# Patient Record
Sex: Female | Born: 1935
Health system: Southern US, Community
[De-identification: ages and names within clinical notes are randomized; demographics above are authoritative.]

## PROBLEM LIST (undated history)

## (undated) DIAGNOSIS — E7800 Pure hypercholesterolemia, unspecified: Secondary | ICD-10-CM

## (undated) DIAGNOSIS — C439 Malignant melanoma of skin, unspecified: Secondary | ICD-10-CM

## (undated) DIAGNOSIS — F0390 Unspecified dementia without behavioral disturbance: Secondary | ICD-10-CM

## (undated) DIAGNOSIS — C801 Malignant (primary) neoplasm, unspecified: Secondary | ICD-10-CM

## (undated) DIAGNOSIS — E079 Disorder of thyroid, unspecified: Secondary | ICD-10-CM

## (undated) DIAGNOSIS — I10 Essential (primary) hypertension: Secondary | ICD-10-CM

## (undated) HISTORY — PX: ABDOMINAL HYSTERECTOMY: SHX81

## (undated) HISTORY — PX: EYE MUSCLE SURGERY: SHX370

---

## 2001-12-12 ENCOUNTER — Emergency Department (HOSPITAL_COMMUNITY): Admission: EM | Admit: 2001-12-12 | Discharge: 2001-12-12 | Payer: Self-pay | Admitting: Emergency Medicine

## 2001-12-12 ENCOUNTER — Encounter: Payer: Self-pay | Admitting: Emergency Medicine

## 2010-04-13 ENCOUNTER — Emergency Department (HOSPITAL_COMMUNITY): Admission: EM | Admit: 2010-04-13 | Discharge: 2010-04-13 | Payer: Self-pay | Admitting: Emergency Medicine

## 2011-11-03 DIAGNOSIS — H04129 Dry eye syndrome of unspecified lacrimal gland: Secondary | ICD-10-CM | POA: Diagnosis not present

## 2011-11-03 DIAGNOSIS — H2510 Age-related nuclear cataract, unspecified eye: Secondary | ICD-10-CM | POA: Diagnosis not present

## 2011-11-03 DIAGNOSIS — H35039 Hypertensive retinopathy, unspecified eye: Secondary | ICD-10-CM | POA: Diagnosis not present

## 2011-11-03 DIAGNOSIS — H353190 Nonexudative age-related macular degeneration, unspecified eye, stage unspecified: Secondary | ICD-10-CM | POA: Diagnosis not present

## 2012-01-13 DIAGNOSIS — Z1231 Encounter for screening mammogram for malignant neoplasm of breast: Secondary | ICD-10-CM | POA: Diagnosis not present

## 2012-01-16 DIAGNOSIS — E7800 Pure hypercholesterolemia, unspecified: Secondary | ICD-10-CM | POA: Diagnosis not present

## 2012-01-16 DIAGNOSIS — Z79899 Other long term (current) drug therapy: Secondary | ICD-10-CM | POA: Diagnosis not present

## 2012-01-16 DIAGNOSIS — K219 Gastro-esophageal reflux disease without esophagitis: Secondary | ICD-10-CM | POA: Diagnosis not present

## 2012-01-16 DIAGNOSIS — E039 Hypothyroidism, unspecified: Secondary | ICD-10-CM | POA: Diagnosis not present

## 2012-05-01 DIAGNOSIS — H2510 Age-related nuclear cataract, unspecified eye: Secondary | ICD-10-CM | POA: Diagnosis not present

## 2012-05-01 DIAGNOSIS — H35039 Hypertensive retinopathy, unspecified eye: Secondary | ICD-10-CM | POA: Diagnosis not present

## 2012-05-01 DIAGNOSIS — H35379 Puckering of macula, unspecified eye: Secondary | ICD-10-CM | POA: Diagnosis not present

## 2012-05-01 DIAGNOSIS — H353190 Nonexudative age-related macular degeneration, unspecified eye, stage unspecified: Secondary | ICD-10-CM | POA: Diagnosis not present

## 2012-05-01 DIAGNOSIS — H35369 Drusen (degenerative) of macula, unspecified eye: Secondary | ICD-10-CM | POA: Diagnosis not present

## 2012-07-18 DIAGNOSIS — Z79899 Other long term (current) drug therapy: Secondary | ICD-10-CM | POA: Diagnosis not present

## 2012-07-18 DIAGNOSIS — N951 Menopausal and female climacteric states: Secondary | ICD-10-CM | POA: Diagnosis not present

## 2012-07-18 DIAGNOSIS — E7800 Pure hypercholesterolemia, unspecified: Secondary | ICD-10-CM | POA: Diagnosis not present

## 2012-07-18 DIAGNOSIS — K219 Gastro-esophageal reflux disease without esophagitis: Secondary | ICD-10-CM | POA: Diagnosis not present

## 2012-07-18 DIAGNOSIS — Z23 Encounter for immunization: Secondary | ICD-10-CM | POA: Diagnosis not present

## 2012-07-18 DIAGNOSIS — E039 Hypothyroidism, unspecified: Secondary | ICD-10-CM | POA: Diagnosis not present

## 2013-01-13 DIAGNOSIS — Z1231 Encounter for screening mammogram for malignant neoplasm of breast: Secondary | ICD-10-CM | POA: Diagnosis not present

## 2013-02-05 DIAGNOSIS — Z0000 Encounter for general adult medical examination without abnormal findings: Secondary | ICD-10-CM | POA: Diagnosis not present

## 2013-05-01 DIAGNOSIS — H3530 Unspecified macular degeneration: Secondary | ICD-10-CM | POA: Diagnosis not present

## 2013-05-01 DIAGNOSIS — H04129 Dry eye syndrome of unspecified lacrimal gland: Secondary | ICD-10-CM | POA: Diagnosis not present

## 2013-05-01 DIAGNOSIS — H35039 Hypertensive retinopathy, unspecified eye: Secondary | ICD-10-CM | POA: Diagnosis not present

## 2013-05-01 DIAGNOSIS — Z961 Presence of intraocular lens: Secondary | ICD-10-CM | POA: Diagnosis not present

## 2013-05-01 DIAGNOSIS — H353190 Nonexudative age-related macular degeneration, unspecified eye, stage unspecified: Secondary | ICD-10-CM | POA: Diagnosis not present

## 2013-05-01 DIAGNOSIS — H2510 Age-related nuclear cataract, unspecified eye: Secondary | ICD-10-CM | POA: Diagnosis not present

## 2013-05-28 ENCOUNTER — Encounter (INDEPENDENT_AMBULATORY_CARE_PROVIDER_SITE_OTHER): Payer: Medicare Other | Admitting: Ophthalmology

## 2013-05-28 DIAGNOSIS — H2510 Age-related nuclear cataract, unspecified eye: Secondary | ICD-10-CM

## 2013-05-28 DIAGNOSIS — H353290 Exudative age-related macular degeneration, unspecified eye, stage unspecified: Secondary | ICD-10-CM | POA: Diagnosis not present

## 2013-05-28 DIAGNOSIS — H3530 Unspecified macular degeneration: Secondary | ICD-10-CM | POA: Diagnosis not present

## 2013-05-28 DIAGNOSIS — H43819 Vitreous degeneration, unspecified eye: Secondary | ICD-10-CM

## 2013-06-25 ENCOUNTER — Encounter (INDEPENDENT_AMBULATORY_CARE_PROVIDER_SITE_OTHER): Payer: Medicare Other | Admitting: Ophthalmology

## 2013-06-25 DIAGNOSIS — H43819 Vitreous degeneration, unspecified eye: Secondary | ICD-10-CM | POA: Diagnosis not present

## 2013-06-25 DIAGNOSIS — H353290 Exudative age-related macular degeneration, unspecified eye, stage unspecified: Secondary | ICD-10-CM

## 2013-06-25 DIAGNOSIS — H2510 Age-related nuclear cataract, unspecified eye: Secondary | ICD-10-CM | POA: Diagnosis not present

## 2013-06-25 DIAGNOSIS — H3530 Unspecified macular degeneration: Secondary | ICD-10-CM

## 2013-07-23 ENCOUNTER — Encounter (INDEPENDENT_AMBULATORY_CARE_PROVIDER_SITE_OTHER): Payer: Medicare Other | Admitting: Ophthalmology

## 2013-07-23 DIAGNOSIS — H2510 Age-related nuclear cataract, unspecified eye: Secondary | ICD-10-CM | POA: Diagnosis not present

## 2013-07-23 DIAGNOSIS — H3530 Unspecified macular degeneration: Secondary | ICD-10-CM | POA: Diagnosis not present

## 2013-07-23 DIAGNOSIS — H353290 Exudative age-related macular degeneration, unspecified eye, stage unspecified: Secondary | ICD-10-CM | POA: Diagnosis not present

## 2013-07-23 DIAGNOSIS — H43819 Vitreous degeneration, unspecified eye: Secondary | ICD-10-CM

## 2013-07-27 DIAGNOSIS — Z23 Encounter for immunization: Secondary | ICD-10-CM | POA: Diagnosis not present

## 2013-08-20 ENCOUNTER — Encounter (INDEPENDENT_AMBULATORY_CARE_PROVIDER_SITE_OTHER): Payer: Medicare Other | Admitting: Ophthalmology

## 2013-08-20 DIAGNOSIS — H353290 Exudative age-related macular degeneration, unspecified eye, stage unspecified: Secondary | ICD-10-CM

## 2013-08-20 DIAGNOSIS — H43819 Vitreous degeneration, unspecified eye: Secondary | ICD-10-CM

## 2013-08-20 DIAGNOSIS — H3530 Unspecified macular degeneration: Secondary | ICD-10-CM

## 2013-08-25 DIAGNOSIS — E7800 Pure hypercholesterolemia, unspecified: Secondary | ICD-10-CM | POA: Diagnosis not present

## 2013-08-25 DIAGNOSIS — R030 Elevated blood-pressure reading, without diagnosis of hypertension: Secondary | ICD-10-CM | POA: Diagnosis not present

## 2013-08-25 DIAGNOSIS — K219 Gastro-esophageal reflux disease without esophagitis: Secondary | ICD-10-CM | POA: Diagnosis not present

## 2013-08-25 DIAGNOSIS — H3530 Unspecified macular degeneration: Secondary | ICD-10-CM | POA: Diagnosis not present

## 2013-08-25 DIAGNOSIS — E039 Hypothyroidism, unspecified: Secondary | ICD-10-CM | POA: Diagnosis not present

## 2013-08-25 DIAGNOSIS — H3550 Unspecified hereditary retinal dystrophy: Secondary | ICD-10-CM | POA: Diagnosis not present

## 2013-09-24 ENCOUNTER — Encounter (INDEPENDENT_AMBULATORY_CARE_PROVIDER_SITE_OTHER): Payer: Medicare Other | Admitting: Ophthalmology

## 2013-09-24 DIAGNOSIS — H43819 Vitreous degeneration, unspecified eye: Secondary | ICD-10-CM | POA: Diagnosis not present

## 2013-09-24 DIAGNOSIS — H353290 Exudative age-related macular degeneration, unspecified eye, stage unspecified: Secondary | ICD-10-CM | POA: Diagnosis not present

## 2013-09-24 DIAGNOSIS — H3530 Unspecified macular degeneration: Secondary | ICD-10-CM | POA: Diagnosis not present

## 2013-09-24 DIAGNOSIS — H2510 Age-related nuclear cataract, unspecified eye: Secondary | ICD-10-CM

## 2013-11-05 ENCOUNTER — Encounter (INDEPENDENT_AMBULATORY_CARE_PROVIDER_SITE_OTHER): Payer: Medicare Other | Admitting: Ophthalmology

## 2013-11-05 DIAGNOSIS — H43819 Vitreous degeneration, unspecified eye: Secondary | ICD-10-CM

## 2013-11-05 DIAGNOSIS — H2510 Age-related nuclear cataract, unspecified eye: Secondary | ICD-10-CM

## 2013-11-05 DIAGNOSIS — H353290 Exudative age-related macular degeneration, unspecified eye, stage unspecified: Secondary | ICD-10-CM | POA: Diagnosis not present

## 2013-11-05 DIAGNOSIS — H3530 Unspecified macular degeneration: Secondary | ICD-10-CM

## 2013-12-17 ENCOUNTER — Encounter (INDEPENDENT_AMBULATORY_CARE_PROVIDER_SITE_OTHER): Payer: Medicare Other | Admitting: Ophthalmology

## 2013-12-17 DIAGNOSIS — H43819 Vitreous degeneration, unspecified eye: Secondary | ICD-10-CM | POA: Diagnosis not present

## 2013-12-17 DIAGNOSIS — H2510 Age-related nuclear cataract, unspecified eye: Secondary | ICD-10-CM | POA: Diagnosis not present

## 2013-12-17 DIAGNOSIS — H3530 Unspecified macular degeneration: Secondary | ICD-10-CM

## 2013-12-17 DIAGNOSIS — H353290 Exudative age-related macular degeneration, unspecified eye, stage unspecified: Secondary | ICD-10-CM

## 2014-01-14 DIAGNOSIS — Z1231 Encounter for screening mammogram for malignant neoplasm of breast: Secondary | ICD-10-CM | POA: Diagnosis not present

## 2014-02-04 ENCOUNTER — Encounter (INDEPENDENT_AMBULATORY_CARE_PROVIDER_SITE_OTHER): Payer: Medicare Other | Admitting: Ophthalmology

## 2014-02-04 DIAGNOSIS — H43819 Vitreous degeneration, unspecified eye: Secondary | ICD-10-CM

## 2014-02-04 DIAGNOSIS — H2510 Age-related nuclear cataract, unspecified eye: Secondary | ICD-10-CM | POA: Diagnosis not present

## 2014-02-04 DIAGNOSIS — H353290 Exudative age-related macular degeneration, unspecified eye, stage unspecified: Secondary | ICD-10-CM | POA: Diagnosis not present

## 2014-02-04 DIAGNOSIS — H3530 Unspecified macular degeneration: Secondary | ICD-10-CM

## 2014-02-09 DIAGNOSIS — H3530 Unspecified macular degeneration: Secondary | ICD-10-CM | POA: Diagnosis not present

## 2014-02-09 DIAGNOSIS — Z1331 Encounter for screening for depression: Secondary | ICD-10-CM | POA: Diagnosis not present

## 2014-02-09 DIAGNOSIS — E7800 Pure hypercholesterolemia, unspecified: Secondary | ICD-10-CM | POA: Diagnosis not present

## 2014-02-09 DIAGNOSIS — K219 Gastro-esophageal reflux disease without esophagitis: Secondary | ICD-10-CM | POA: Diagnosis not present

## 2014-02-09 DIAGNOSIS — H9319 Tinnitus, unspecified ear: Secondary | ICD-10-CM | POA: Diagnosis not present

## 2014-02-09 DIAGNOSIS — Z0000 Encounter for general adult medical examination without abnormal findings: Secondary | ICD-10-CM | POA: Diagnosis not present

## 2014-02-09 DIAGNOSIS — Z23 Encounter for immunization: Secondary | ICD-10-CM | POA: Diagnosis not present

## 2014-02-09 DIAGNOSIS — E039 Hypothyroidism, unspecified: Secondary | ICD-10-CM | POA: Diagnosis not present

## 2014-02-09 DIAGNOSIS — I10 Essential (primary) hypertension: Secondary | ICD-10-CM | POA: Diagnosis not present

## 2014-02-19 DIAGNOSIS — H903 Sensorineural hearing loss, bilateral: Secondary | ICD-10-CM | POA: Diagnosis not present

## 2014-02-19 DIAGNOSIS — H9319 Tinnitus, unspecified ear: Secondary | ICD-10-CM | POA: Diagnosis not present

## 2014-02-25 DIAGNOSIS — E2839 Other primary ovarian failure: Secondary | ICD-10-CM | POA: Diagnosis not present

## 2014-02-25 DIAGNOSIS — M899 Disorder of bone, unspecified: Secondary | ICD-10-CM | POA: Diagnosis not present

## 2014-03-03 DIAGNOSIS — E039 Hypothyroidism, unspecified: Secondary | ICD-10-CM | POA: Diagnosis not present

## 2014-03-03 DIAGNOSIS — M899 Disorder of bone, unspecified: Secondary | ICD-10-CM | POA: Diagnosis not present

## 2014-03-03 DIAGNOSIS — I10 Essential (primary) hypertension: Secondary | ICD-10-CM | POA: Diagnosis not present

## 2014-03-03 DIAGNOSIS — E7800 Pure hypercholesterolemia, unspecified: Secondary | ICD-10-CM | POA: Diagnosis not present

## 2014-03-03 DIAGNOSIS — M949 Disorder of cartilage, unspecified: Secondary | ICD-10-CM | POA: Diagnosis not present

## 2014-03-30 DIAGNOSIS — E039 Hypothyroidism, unspecified: Secondary | ICD-10-CM | POA: Diagnosis not present

## 2014-03-30 DIAGNOSIS — M899 Disorder of bone, unspecified: Secondary | ICD-10-CM | POA: Diagnosis not present

## 2014-03-30 DIAGNOSIS — E7800 Pure hypercholesterolemia, unspecified: Secondary | ICD-10-CM | POA: Diagnosis not present

## 2014-04-08 ENCOUNTER — Encounter (INDEPENDENT_AMBULATORY_CARE_PROVIDER_SITE_OTHER): Payer: Medicare Other | Admitting: Ophthalmology

## 2014-04-08 DIAGNOSIS — H43819 Vitreous degeneration, unspecified eye: Secondary | ICD-10-CM | POA: Diagnosis not present

## 2014-04-08 DIAGNOSIS — H353290 Exudative age-related macular degeneration, unspecified eye, stage unspecified: Secondary | ICD-10-CM | POA: Diagnosis not present

## 2014-04-08 DIAGNOSIS — H3530 Unspecified macular degeneration: Secondary | ICD-10-CM | POA: Diagnosis not present

## 2014-06-10 ENCOUNTER — Encounter (INDEPENDENT_AMBULATORY_CARE_PROVIDER_SITE_OTHER): Payer: Medicare Other | Admitting: Ophthalmology

## 2014-06-10 DIAGNOSIS — H2510 Age-related nuclear cataract, unspecified eye: Secondary | ICD-10-CM | POA: Diagnosis not present

## 2014-06-10 DIAGNOSIS — H43819 Vitreous degeneration, unspecified eye: Secondary | ICD-10-CM | POA: Diagnosis not present

## 2014-06-10 DIAGNOSIS — H353290 Exudative age-related macular degeneration, unspecified eye, stage unspecified: Secondary | ICD-10-CM

## 2014-06-10 DIAGNOSIS — H3530 Unspecified macular degeneration: Secondary | ICD-10-CM

## 2014-08-03 DIAGNOSIS — M899 Disorder of bone, unspecified: Secondary | ICD-10-CM | POA: Diagnosis not present

## 2014-08-03 DIAGNOSIS — G479 Sleep disorder, unspecified: Secondary | ICD-10-CM | POA: Diagnosis not present

## 2014-08-03 DIAGNOSIS — I10 Essential (primary) hypertension: Secondary | ICD-10-CM | POA: Diagnosis not present

## 2014-08-03 DIAGNOSIS — Z23 Encounter for immunization: Secondary | ICD-10-CM | POA: Diagnosis not present

## 2014-08-03 DIAGNOSIS — K219 Gastro-esophageal reflux disease without esophagitis: Secondary | ICD-10-CM | POA: Diagnosis not present

## 2014-08-03 DIAGNOSIS — E780 Pure hypercholesterolemia: Secondary | ICD-10-CM | POA: Diagnosis not present

## 2014-08-03 DIAGNOSIS — E039 Hypothyroidism, unspecified: Secondary | ICD-10-CM | POA: Diagnosis not present

## 2014-08-19 ENCOUNTER — Encounter (INDEPENDENT_AMBULATORY_CARE_PROVIDER_SITE_OTHER): Payer: Medicare Other | Admitting: Ophthalmology

## 2014-08-19 DIAGNOSIS — H3532 Exudative age-related macular degeneration: Secondary | ICD-10-CM

## 2014-08-19 DIAGNOSIS — H43813 Vitreous degeneration, bilateral: Secondary | ICD-10-CM | POA: Diagnosis not present

## 2014-08-19 DIAGNOSIS — H2512 Age-related nuclear cataract, left eye: Secondary | ICD-10-CM | POA: Diagnosis not present

## 2014-08-19 DIAGNOSIS — H3531 Nonexudative age-related macular degeneration: Secondary | ICD-10-CM

## 2014-11-04 ENCOUNTER — Encounter (INDEPENDENT_AMBULATORY_CARE_PROVIDER_SITE_OTHER): Payer: Medicare Other | Admitting: Ophthalmology

## 2014-11-04 DIAGNOSIS — H3531 Nonexudative age-related macular degeneration: Secondary | ICD-10-CM

## 2014-11-04 DIAGNOSIS — H3532 Exudative age-related macular degeneration: Secondary | ICD-10-CM

## 2014-11-04 DIAGNOSIS — H43813 Vitreous degeneration, bilateral: Secondary | ICD-10-CM | POA: Diagnosis not present

## 2014-11-04 DIAGNOSIS — H2512 Age-related nuclear cataract, left eye: Secondary | ICD-10-CM | POA: Diagnosis not present

## 2015-01-13 ENCOUNTER — Encounter (INDEPENDENT_AMBULATORY_CARE_PROVIDER_SITE_OTHER): Payer: Medicare Other | Admitting: Ophthalmology

## 2015-01-13 DIAGNOSIS — H3531 Nonexudative age-related macular degeneration: Secondary | ICD-10-CM

## 2015-01-13 DIAGNOSIS — H43813 Vitreous degeneration, bilateral: Secondary | ICD-10-CM | POA: Diagnosis not present

## 2015-01-13 DIAGNOSIS — H3532 Exudative age-related macular degeneration: Secondary | ICD-10-CM

## 2015-02-12 DIAGNOSIS — E780 Pure hypercholesterolemia: Secondary | ICD-10-CM | POA: Diagnosis not present

## 2015-02-12 DIAGNOSIS — E559 Vitamin D deficiency, unspecified: Secondary | ICD-10-CM | POA: Diagnosis not present

## 2015-02-12 DIAGNOSIS — I10 Essential (primary) hypertension: Secondary | ICD-10-CM | POA: Diagnosis not present

## 2015-02-12 DIAGNOSIS — M85859 Other specified disorders of bone density and structure, unspecified thigh: Secondary | ICD-10-CM | POA: Diagnosis not present

## 2015-02-12 DIAGNOSIS — G479 Sleep disorder, unspecified: Secondary | ICD-10-CM | POA: Diagnosis not present

## 2015-02-12 DIAGNOSIS — E039 Hypothyroidism, unspecified: Secondary | ICD-10-CM | POA: Diagnosis not present

## 2015-02-12 DIAGNOSIS — K219 Gastro-esophageal reflux disease without esophagitis: Secondary | ICD-10-CM | POA: Diagnosis not present

## 2015-02-12 DIAGNOSIS — Z0000 Encounter for general adult medical examination without abnormal findings: Secondary | ICD-10-CM | POA: Diagnosis not present

## 2015-03-09 DIAGNOSIS — L03031 Cellulitis of right toe: Secondary | ICD-10-CM | POA: Diagnosis not present

## 2015-03-09 DIAGNOSIS — M79609 Pain in unspecified limb: Secondary | ICD-10-CM | POA: Diagnosis not present

## 2015-03-31 ENCOUNTER — Encounter (INDEPENDENT_AMBULATORY_CARE_PROVIDER_SITE_OTHER): Payer: Medicare Other | Admitting: Ophthalmology

## 2015-03-31 DIAGNOSIS — H3531 Nonexudative age-related macular degeneration: Secondary | ICD-10-CM | POA: Diagnosis not present

## 2015-03-31 DIAGNOSIS — H3532 Exudative age-related macular degeneration: Secondary | ICD-10-CM | POA: Diagnosis not present

## 2015-03-31 DIAGNOSIS — H2512 Age-related nuclear cataract, left eye: Secondary | ICD-10-CM

## 2015-03-31 DIAGNOSIS — H43813 Vitreous degeneration, bilateral: Secondary | ICD-10-CM | POA: Diagnosis not present

## 2015-04-22 DIAGNOSIS — H25012 Cortical age-related cataract, left eye: Secondary | ICD-10-CM | POA: Diagnosis not present

## 2015-04-22 DIAGNOSIS — H2512 Age-related nuclear cataract, left eye: Secondary | ICD-10-CM | POA: Diagnosis not present

## 2015-04-22 DIAGNOSIS — H35033 Hypertensive retinopathy, bilateral: Secondary | ICD-10-CM | POA: Diagnosis not present

## 2015-04-22 DIAGNOSIS — H3531 Nonexudative age-related macular degeneration: Secondary | ICD-10-CM | POA: Diagnosis not present

## 2015-05-11 DIAGNOSIS — L03115 Cellulitis of right lower limb: Secondary | ICD-10-CM | POA: Diagnosis not present

## 2015-05-11 DIAGNOSIS — L03032 Cellulitis of left toe: Secondary | ICD-10-CM | POA: Diagnosis not present

## 2015-06-02 DIAGNOSIS — Z1231 Encounter for screening mammogram for malignant neoplasm of breast: Secondary | ICD-10-CM | POA: Diagnosis not present

## 2015-06-23 ENCOUNTER — Encounter (INDEPENDENT_AMBULATORY_CARE_PROVIDER_SITE_OTHER): Payer: Medicare Other | Admitting: Ophthalmology

## 2015-06-23 DIAGNOSIS — H35033 Hypertensive retinopathy, bilateral: Secondary | ICD-10-CM | POA: Diagnosis not present

## 2015-06-23 DIAGNOSIS — H3531 Nonexudative age-related macular degeneration: Secondary | ICD-10-CM | POA: Diagnosis not present

## 2015-06-23 DIAGNOSIS — H3532 Exudative age-related macular degeneration: Secondary | ICD-10-CM | POA: Diagnosis not present

## 2015-06-23 DIAGNOSIS — I10 Essential (primary) hypertension: Secondary | ICD-10-CM | POA: Diagnosis not present

## 2015-06-23 DIAGNOSIS — H43813 Vitreous degeneration, bilateral: Secondary | ICD-10-CM | POA: Diagnosis not present

## 2015-06-23 DIAGNOSIS — H2512 Age-related nuclear cataract, left eye: Secondary | ICD-10-CM

## 2015-07-29 DIAGNOSIS — H01003 Unspecified blepharitis right eye, unspecified eyelid: Secondary | ICD-10-CM | POA: Diagnosis not present

## 2015-07-29 DIAGNOSIS — H04129 Dry eye syndrome of unspecified lacrimal gland: Secondary | ICD-10-CM | POA: Diagnosis not present

## 2015-07-29 DIAGNOSIS — H2512 Age-related nuclear cataract, left eye: Secondary | ICD-10-CM | POA: Diagnosis not present

## 2015-07-29 DIAGNOSIS — H53002 Unspecified amblyopia, left eye: Secondary | ICD-10-CM | POA: Diagnosis not present

## 2015-08-02 DIAGNOSIS — B029 Zoster without complications: Secondary | ICD-10-CM | POA: Diagnosis not present

## 2015-08-02 DIAGNOSIS — B0221 Postherpetic geniculate ganglionitis: Secondary | ICD-10-CM | POA: Diagnosis not present

## 2015-08-09 DIAGNOSIS — B0221 Postherpetic geniculate ganglionitis: Secondary | ICD-10-CM | POA: Diagnosis not present

## 2015-08-16 DIAGNOSIS — K219 Gastro-esophageal reflux disease without esophagitis: Secondary | ICD-10-CM | POA: Diagnosis not present

## 2015-08-16 DIAGNOSIS — E7800 Pure hypercholesterolemia, unspecified: Secondary | ICD-10-CM | POA: Diagnosis not present

## 2015-08-16 DIAGNOSIS — I10 Essential (primary) hypertension: Secondary | ICD-10-CM | POA: Diagnosis not present

## 2015-08-16 DIAGNOSIS — R634 Abnormal weight loss: Secondary | ICD-10-CM | POA: Diagnosis not present

## 2015-08-16 DIAGNOSIS — E039 Hypothyroidism, unspecified: Secondary | ICD-10-CM | POA: Diagnosis not present

## 2015-08-16 DIAGNOSIS — Z23 Encounter for immunization: Secondary | ICD-10-CM | POA: Diagnosis not present

## 2015-08-16 DIAGNOSIS — M85859 Other specified disorders of bone density and structure, unspecified thigh: Secondary | ICD-10-CM | POA: Diagnosis not present

## 2015-08-16 DIAGNOSIS — G479 Sleep disorder, unspecified: Secondary | ICD-10-CM | POA: Diagnosis not present

## 2015-08-16 DIAGNOSIS — E559 Vitamin D deficiency, unspecified: Secondary | ICD-10-CM | POA: Diagnosis not present

## 2015-08-16 DIAGNOSIS — B029 Zoster without complications: Secondary | ICD-10-CM | POA: Diagnosis not present

## 2015-08-19 DIAGNOSIS — B0229 Other postherpetic nervous system involvement: Secondary | ICD-10-CM | POA: Diagnosis not present

## 2015-08-19 DIAGNOSIS — B0221 Postherpetic geniculate ganglionitis: Secondary | ICD-10-CM | POA: Diagnosis not present

## 2015-09-06 DIAGNOSIS — B0221 Postherpetic geniculate ganglionitis: Secondary | ICD-10-CM | POA: Diagnosis not present

## 2015-09-06 DIAGNOSIS — B0229 Other postherpetic nervous system involvement: Secondary | ICD-10-CM | POA: Diagnosis not present

## 2015-09-06 DIAGNOSIS — H01003 Unspecified blepharitis right eye, unspecified eyelid: Secondary | ICD-10-CM | POA: Diagnosis not present

## 2015-09-20 DIAGNOSIS — B0229 Other postherpetic nervous system involvement: Secondary | ICD-10-CM | POA: Diagnosis not present

## 2015-10-06 ENCOUNTER — Encounter (INDEPENDENT_AMBULATORY_CARE_PROVIDER_SITE_OTHER): Payer: Medicare Other | Admitting: Ophthalmology

## 2015-10-06 DIAGNOSIS — H35033 Hypertensive retinopathy, bilateral: Secondary | ICD-10-CM

## 2015-10-06 DIAGNOSIS — H353122 Nonexudative age-related macular degeneration, left eye, intermediate dry stage: Secondary | ICD-10-CM | POA: Diagnosis not present

## 2015-10-06 DIAGNOSIS — H353211 Exudative age-related macular degeneration, right eye, with active choroidal neovascularization: Secondary | ICD-10-CM | POA: Diagnosis not present

## 2015-10-06 DIAGNOSIS — I10 Essential (primary) hypertension: Secondary | ICD-10-CM | POA: Diagnosis not present

## 2015-10-06 DIAGNOSIS — H43813 Vitreous degeneration, bilateral: Secondary | ICD-10-CM

## 2015-11-29 DIAGNOSIS — B0229 Other postherpetic nervous system involvement: Secondary | ICD-10-CM | POA: Diagnosis not present

## 2015-12-06 DIAGNOSIS — R2689 Other abnormalities of gait and mobility: Secondary | ICD-10-CM | POA: Diagnosis not present

## 2015-12-06 DIAGNOSIS — B0229 Other postherpetic nervous system involvement: Secondary | ICD-10-CM | POA: Diagnosis not present

## 2015-12-06 DIAGNOSIS — E039 Hypothyroidism, unspecified: Secondary | ICD-10-CM | POA: Diagnosis not present

## 2015-12-15 ENCOUNTER — Encounter: Payer: Self-pay | Admitting: Physical Therapy

## 2015-12-15 ENCOUNTER — Ambulatory Visit: Payer: Medicare Other | Attending: Family Medicine | Admitting: Physical Therapy

## 2015-12-15 DIAGNOSIS — R262 Difficulty in walking, not elsewhere classified: Secondary | ICD-10-CM | POA: Diagnosis not present

## 2015-12-15 DIAGNOSIS — M6281 Muscle weakness (generalized): Secondary | ICD-10-CM | POA: Diagnosis not present

## 2015-12-15 NOTE — Patient Instructions (Signed)
Single Leg - Eyes Open  Holding support, lift right leg while maintaining balance over other leg. Progress to removing hands from support surface for longer periods of time. Hold____ seconds. Repeat ____ times per session. Do ____ sessions per day. Single Leg (Compliant Surface) - Eyes Open  Stand on compliant surface: ________ holding support. Lift right leg while maintaining balance over other leg. Progress to removing hands from support surface for longer periods of time. Hold____ seconds. Repeat ____ times per session. Do ____ sessions per day.  Feet Heel-Toe "Tandem", Arm Motion  Eyes Open  With eyes open, right foot directly in front of the other, move arms up and down: to front. Repeat ____ times per session. Do ____ sessions per day. Feet Heel-Toe "Tandem" (Compliant Surface) Arm Motion - Eyes Open  With eyes open, standing on compliant surface: ________, right foot directly in front of the other, move arms up and down: to front. Repeat ____ times per session. Do ____ per day.  Feet Heel-Toe "Tandem"   Arms outstretched, walk a straight line bringing one foot directly in front of the other. Repeat for ____ minutes per session. _ per day. Side-Stepping   Walk to left side with eyes open. Take even steps, leading with same foot. Repeat in other direction. Repeat for _ minutes per session.Do __ per day.

## 2015-12-15 NOTE — Therapy (Signed)
[address] [place], [place], [address] Phone: [phone]   Fax:  [phone]  Physical Therapy Evaluation  Patient Details  Name: Karen Pearson MRN: [id number] Date of Birth: [birth year] Referring Provider: Cari Caraway  Encounter Date: 12/15/2015      PT End of Session - 12/15/15 1043    Visit Number 1   PT Start Time [id number]   PT Stop Time 1103   PT Time Calculation (min) 41 min   Activity Tolerance Patient tolerated treatment well   Behavior During Therapy [facility] for tasks assessed/performed      History reviewed. No pertinent past medical history.  History reviewed. No pertinent past surgical history.  There were no vitals filed for this visit.  Visit Diagnosis:  Weakness - Plan: PT plan of care cert/re-cert  Debility - Plan: PT plan of care cert/re-cert      Subjective Assessment - 12/15/15 1026    Subjective Patient got the shingles about 4 months ago, she has been weaker and has had some stumbles, she reports that she has always been with her husband and he ususally catches her so she denies falls and reports that she does not feel like she needs therapy.   Limitations House hold activities   Patient Stated Goals reports that she feels like she is doing everything that she needs   Currently in Pain? No/denies            [facility] PT Assessment - 12/15/15 0001    Assessment   Medical Diagnosis debility   Referring Provider Cari Caraway   Onset Date/Surgical Date 08/14/15   Prior Therapy none   Precautions   Precautions None   Balance Screen   Has the patient fallen in the past 6 months No   Has the patient had a decrease in activity level because of a fear of falling?  No   Is the patient reluctant to leave their home because of a fear of falling?  No   Home Environment   Additional Comments lives with spouse, does her own houswork, does her own yardwork and gardening   Prior  Function   Level of [place] Retired   Leisure likes to garden   Mining engineer Comments fwd head, rounded shoulders   AROM   Overall AROM Comments LE's and lumbar ROM are WFL's   Strength   Overall Strength Comments 4/5   Standardized Balance Assessment   Standardized Balance Assessment Timed Up and Go Test   Berg Balance Test   Sit to Stand Able to stand without using hands and stabilize independently   Standing Unsupported Able to stand safely 2 minutes   Sitting with Back Unsupported but Feet Supported on Floor or Stool Able to sit safely and securely 2 minutes   Stand to Sit Sits safely with minimal use of hands   Transfers Able to transfer safely, minor use of hands   Standing Unsupported with Eyes Closed Able to stand 10 seconds with supervision   Standing Ubsupported with Feet Together Able to place feet together independently and stand 1 minute safely   From Standing, Reach Forward with Outstretched Arm Can reach confidently >25 cm (10")   From Standing Position, Pick up Object from Floor Able to pick up shoe safely and easily   From Standing Position, Turn to Look Behind Over each Shoulder Looks behind from both sides and weight shifts well  Turn 360 Degrees Able to turn 360 degrees safely one side only in 4 seconds or less   Standing Unsupported, Alternately Place Feet on Step/Stool Able to stand independently and complete 8 steps >20 seconds   Standing Unsupported, One Foot in Front Able to plae foot ahead of the other independently and hold 30 seconds   Standing on One Leg Able to lift leg independently and hold equal to or more than 3 seconds   Total Score 50   Timed Up and Go Test   Normal TUG (seconds) 15                                PT Long Term Goals - 01/04/16 1100    PT LONG TERM GOAL #1   Title independent with initial HEP   Time 1   Period Days   Status Achieved                Plan - 01-04-16 1057    Clinical Impression Statement Patient with shingles for the past 4 months, reports some weakness and unsteadiness.  She however feels like she is getting better and does not need PT.  Her timed up and go test was 15 seconds and her Berg balance score was 50/56.  Showing only very mild weakness and mild balance issues   Pt will benefit from skilled therapeutic intervention in order to improve on the following deficits Difficulty walking;Decreased strength   Rehab Potential Excellent   PT Frequency One time visit   PT Treatment/Interventions Balance training   PT Next Visit Plan Patient was given a home balance exercise program,  We will D/C as she was independent and safe with this.   Consulted and Agree with Plan of Care Patient          G-Codes - 01-04-16 1100    Functional Assessment Tool Used PT discretion   Functional Limitation Mobility: Walking and moving around   Mobility: Walking and Moving Around Current Status [phone]) At least 1 percent but less than 20 percent impaired, limited or restricted   Mobility: Walking and Moving Around Goal Status [phone]) At least 1 percent but less than 20 percent impaired, limited or restricted   Mobility: Walking and Moving Around Discharge Status [phone]) At least 1 percent but less than 20 percent impaired, limited or restricted       Problem List There are no active problems to display for this patient.   Sumner Boast., PT 2016-01-04, 11:04 AM  [address] [place], [place], [address] Phone: [phone]   Fax:  [phone]  Name: Karen Pearson MRN: [id number] Date of Birth: 1925/06/04

## 2016-02-14 DIAGNOSIS — B0229 Other postherpetic nervous system involvement: Secondary | ICD-10-CM | POA: Diagnosis not present

## 2016-02-14 DIAGNOSIS — E559 Vitamin D deficiency, unspecified: Secondary | ICD-10-CM | POA: Diagnosis not present

## 2016-02-14 DIAGNOSIS — K219 Gastro-esophageal reflux disease without esophagitis: Secondary | ICD-10-CM | POA: Diagnosis not present

## 2016-02-14 DIAGNOSIS — E039 Hypothyroidism, unspecified: Secondary | ICD-10-CM | POA: Diagnosis not present

## 2016-02-14 DIAGNOSIS — E785 Hyperlipidemia, unspecified: Secondary | ICD-10-CM | POA: Diagnosis not present

## 2016-02-14 DIAGNOSIS — G479 Sleep disorder, unspecified: Secondary | ICD-10-CM | POA: Diagnosis not present

## 2016-02-14 DIAGNOSIS — R2689 Other abnormalities of gait and mobility: Secondary | ICD-10-CM | POA: Diagnosis not present

## 2016-02-14 DIAGNOSIS — I10 Essential (primary) hypertension: Secondary | ICD-10-CM | POA: Diagnosis not present

## 2016-02-16 ENCOUNTER — Encounter (INDEPENDENT_AMBULATORY_CARE_PROVIDER_SITE_OTHER): Payer: Medicare Other | Admitting: Ophthalmology

## 2016-02-16 DIAGNOSIS — H2512 Age-related nuclear cataract, left eye: Secondary | ICD-10-CM

## 2016-02-16 DIAGNOSIS — I10 Essential (primary) hypertension: Secondary | ICD-10-CM | POA: Diagnosis not present

## 2016-02-16 DIAGNOSIS — H43813 Vitreous degeneration, bilateral: Secondary | ICD-10-CM

## 2016-02-16 DIAGNOSIS — H35033 Hypertensive retinopathy, bilateral: Secondary | ICD-10-CM

## 2016-02-16 DIAGNOSIS — H353124 Nonexudative age-related macular degeneration, left eye, advanced atrophic with subfoveal involvement: Secondary | ICD-10-CM | POA: Diagnosis not present

## 2016-02-16 DIAGNOSIS — H353211 Exudative age-related macular degeneration, right eye, with active choroidal neovascularization: Secondary | ICD-10-CM

## 2016-02-24 NOTE — Addendum Note (Signed)
Addended by: Sumner Boast on: 02/24/2016 11:55 AM   Modules accepted: Orders

## 2016-03-01 DIAGNOSIS — D225 Melanocytic nevi of trunk: Secondary | ICD-10-CM | POA: Diagnosis not present

## 2016-03-01 DIAGNOSIS — L570 Actinic keratosis: Secondary | ICD-10-CM | POA: Diagnosis not present

## 2016-03-01 DIAGNOSIS — D0461 Carcinoma in situ of skin of right upper limb, including shoulder: Secondary | ICD-10-CM | POA: Diagnosis not present

## 2016-03-01 DIAGNOSIS — X32XXXA Exposure to sunlight, initial encounter: Secondary | ICD-10-CM | POA: Diagnosis not present

## 2016-04-11 DIAGNOSIS — Z85828 Personal history of other malignant neoplasm of skin: Secondary | ICD-10-CM | POA: Diagnosis not present

## 2016-04-11 DIAGNOSIS — Z08 Encounter for follow-up examination after completed treatment for malignant neoplasm: Secondary | ICD-10-CM | POA: Diagnosis not present

## 2016-04-22 ENCOUNTER — Encounter (HOSPITAL_COMMUNITY): Payer: Self-pay

## 2016-04-22 ENCOUNTER — Inpatient Hospital Stay (HOSPITAL_COMMUNITY): Payer: Medicare Other

## 2016-04-22 ENCOUNTER — Inpatient Hospital Stay (HOSPITAL_COMMUNITY)
Admission: EM | Admit: 2016-04-22 | Discharge: 2016-04-23 | DRG: 087 | Disposition: A | Discharge status: Home / Self Care | Payer: Medicare Other | Attending: Internal Medicine | Admitting: Internal Medicine

## 2016-04-22 ENCOUNTER — Emergency Department (HOSPITAL_COMMUNITY): Payer: Medicare Other

## 2016-04-22 DIAGNOSIS — R55 Syncope and collapse: Secondary | ICD-10-CM

## 2016-04-22 DIAGNOSIS — S0003XA Contusion of scalp, initial encounter: Secondary | ICD-10-CM | POA: Diagnosis present

## 2016-04-22 DIAGNOSIS — I619 Nontraumatic intracerebral hemorrhage, unspecified: Secondary | ICD-10-CM

## 2016-04-22 DIAGNOSIS — Z23 Encounter for immunization: Secondary | ICD-10-CM

## 2016-04-22 DIAGNOSIS — S065X9A Traumatic subdural hemorrhage with loss of consciousness of unspecified duration, initial encounter: Secondary | ICD-10-CM | POA: Diagnosis not present

## 2016-04-22 DIAGNOSIS — S199XXA Unspecified injury of neck, initial encounter: Secondary | ICD-10-CM | POA: Diagnosis not present

## 2016-04-22 DIAGNOSIS — I609 Nontraumatic subarachnoid hemorrhage, unspecified: Secondary | ICD-10-CM

## 2016-04-22 DIAGNOSIS — Z85820 Personal history of malignant melanoma of skin: Secondary | ICD-10-CM | POA: Diagnosis not present

## 2016-04-22 DIAGNOSIS — Z791 Long term (current) use of non-steroidal anti-inflammatories (NSAID): Secondary | ICD-10-CM

## 2016-04-22 DIAGNOSIS — I10 Essential (primary) hypertension: Secondary | ICD-10-CM | POA: Diagnosis present

## 2016-04-22 DIAGNOSIS — E7800 Pure hypercholesterolemia, unspecified: Secondary | ICD-10-CM | POA: Diagnosis present

## 2016-04-22 DIAGNOSIS — Z79899 Other long term (current) drug therapy: Secondary | ICD-10-CM | POA: Diagnosis not present

## 2016-04-22 DIAGNOSIS — W1812XA Fall from or off toilet with subsequent striking against object, initial encounter: Secondary | ICD-10-CM | POA: Diagnosis present

## 2016-04-22 DIAGNOSIS — E039 Hypothyroidism, unspecified: Secondary | ICD-10-CM | POA: Diagnosis not present

## 2016-04-22 DIAGNOSIS — S06360A Traumatic hemorrhage of cerebrum, unspecified, without loss of consciousness, initial encounter: Secondary | ICD-10-CM | POA: Diagnosis not present

## 2016-04-22 DIAGNOSIS — Y92002 Bathroom of unspecified non-institutional (private) residence single-family (private) house as the place of occurrence of the external cause: Secondary | ICD-10-CM

## 2016-04-22 DIAGNOSIS — S062X9A Diffuse traumatic brain injury with loss of consciousness of unspecified duration, initial encounter: Secondary | ICD-10-CM | POA: Diagnosis not present

## 2016-04-22 DIAGNOSIS — S066X0A Traumatic subarachnoid hemorrhage without loss of consciousness, initial encounter: Principal | ICD-10-CM | POA: Diagnosis present

## 2016-04-22 DIAGNOSIS — I6000 Nontraumatic subarachnoid hemorrhage from unspecified carotid siphon and bifurcation: Secondary | ICD-10-CM | POA: Diagnosis not present

## 2016-04-22 DIAGNOSIS — S065X9D Traumatic subdural hemorrhage with loss of consciousness of unspecified duration, subsequent encounter: Secondary | ICD-10-CM | POA: Diagnosis not present

## 2016-04-22 HISTORY — DX: Essential (primary) hypertension: I10

## 2016-04-22 HISTORY — DX: Disorder of thyroid, unspecified: E07.9

## 2016-04-22 HISTORY — DX: Syncope and collapse: R55

## 2016-04-22 HISTORY — DX: Malignant melanoma of skin, unspecified: C43.9

## 2016-04-22 HISTORY — DX: Malignant (primary) neoplasm, unspecified: C80.1

## 2016-04-22 HISTORY — DX: Pure hypercholesterolemia, unspecified: E78.00

## 2016-04-22 LAB — ECHOCARDIOGRAM COMPLETE
AVLVOTPG: 6 mmHg
CHL CUP MV DEC (S): 292
E decel time: 292 msec
E/e' ratio: 16.02
FS: 26 % — AB (ref 28–44)
Height: 62 in
IV/PV OW: 1.16
LA diam index: 2.09 cm/m2
LA vol index: 22.2 mL/m2
LA vol: 35.1 mL
LASIZE: 33 mm
LAVOLA4C: 31.5 mL
LDCA: 2.84 cm2
LEFT ATRIUM END SYS DIAM: 33 mm
LV E/e' medial: 16.02
LV E/e'average: 16.02
LV e' LATERAL: 4.57 cm/s
LVOT SV: 78 mL
LVOT VTI: 27.3 cm
LVOT peak vel: 123 cm/s
LVOTD: 19 mm
MV pk E vel: 73.2 m/s
MVPG: 2 mmHg
MVPKAVEL: 105 m/s
PW: 11.1 mm — AB (ref 0.6–1.1)
Reg peak vel: 240 cm/s
TDI e' lateral: 4.57
TDI e' medial: 5.33
TRMAXVEL: 240 cm/s
Weight: 2052.8 oz

## 2016-04-22 LAB — CBC WITH DIFFERENTIAL/PLATELET
BASOS ABS: 0 10*3/uL (ref 0.0–0.1)
Basophils Relative: 0 %
EOS PCT: 1 %
Eosinophils Absolute: 0.1 10*3/uL (ref 0.0–0.7)
HCT: 36.1 % (ref 36.0–46.0)
HEMOGLOBIN: 11.8 g/dL — AB (ref 12.0–15.0)
LYMPHS ABS: 1 10*3/uL (ref 0.7–4.0)
LYMPHS PCT: 15 %
MCH: 30.3 pg (ref 26.0–34.0)
MCHC: 32.7 g/dL (ref 30.0–36.0)
MCV: 92.6 fL (ref 78.0–100.0)
Monocytes Absolute: 0.5 10*3/uL (ref 0.1–1.0)
Monocytes Relative: 8 %
NEUTROS ABS: 5.3 10*3/uL (ref 1.7–7.7)
NEUTROS PCT: 76 %
PLATELETS: 223 10*3/uL (ref 150–400)
RBC: 3.9 MIL/uL (ref 3.87–5.11)
RDW: 13.4 % (ref 11.5–15.5)
WBC: 7 10*3/uL (ref 4.0–10.5)

## 2016-04-22 LAB — COMPREHENSIVE METABOLIC PANEL
ALK PHOS: 64 U/L (ref 38–126)
ALT: 16 U/L (ref 14–54)
AST: 23 U/L (ref 15–41)
Albumin: 4.1 g/dL (ref 3.5–5.0)
Anion gap: 6 (ref 5–15)
BUN: 19 mg/dL (ref 6–20)
CALCIUM: 9 mg/dL (ref 8.9–10.3)
CHLORIDE: 106 mmol/L (ref 101–111)
CO2: 29 mmol/L (ref 22–32)
CREATININE: 0.81 mg/dL (ref 0.44–1.00)
GFR calc Af Amer: >60 mL/min (ref >60)
Glucose, Bld: 99 mg/dL (ref 65–99)
Potassium: 4.3 mmol/L (ref 3.5–5.1)
Sodium: 141 mmol/L (ref 135–145)
Total Bilirubin: 0.4 mg/dL (ref 0.3–1.2)
Total Protein: 6.9 g/dL (ref 6.5–8.1)

## 2016-04-22 LAB — CBG MONITORING, ED: Glucose-Capillary: 84 mg/dL (ref 65–99)

## 2016-04-22 LAB — I-STAT TROPONIN, ED: TROPONIN I, POC: 0 ng/mL (ref 0.00–0.08)

## 2016-04-22 MED ORDER — ACETAMINOPHEN 650 MG RE SUPP: 650.0000 mg | Freq: Four times a day (QID) | RECTAL | Status: DC | PRN

## 2016-04-22 MED ORDER — LEVOTHYROXINE SODIUM 50 MCG PO TABS
75.0000 ug | ORAL_TABLET | Freq: Every day | ORAL | Status: DC
  Administered 2016-04-23: 75 ug via ORAL
  Filled 2016-04-22: qty 1

## 2016-04-22 MED ORDER — SODIUM CHLORIDE 0.9% FLUSH
3.0000 mL | Freq: Two times a day (BID) | INTRAVENOUS | Status: DC
  Administered 2016-04-22 – 2016-04-23 (×3): 3 mL via INTRAVENOUS

## 2016-04-22 MED ORDER — ENOXAPARIN SODIUM 40 MG/0.4ML ~~LOC~~ SOLN: 40.0000 mg | SUBCUTANEOUS | Status: DC

## 2016-04-22 MED ORDER — SODIUM CHLORIDE 0.9 % IV BOLUS (SEPSIS)
1000.0000 mL | Freq: Once | INTRAVENOUS | Status: AC
Start: 2016-04-22 — End: 2016-04-22
  Administered 2016-04-22: 1000 mL via INTRAVENOUS

## 2016-04-22 MED ORDER — ACETAMINOPHEN 325 MG PO TABS: 650.0000 mg | ORAL_TABLET | Freq: Four times a day (QID) | ORAL | Status: DC | PRN

## 2016-04-22 MED ORDER — TETANUS-DIPHTH-ACELL PERTUSSIS 5-2.5-18.5 LF-MCG/0.5 IM SUSP
0.5000 mL | Freq: Once | INTRAMUSCULAR | Status: AC
  Administered 2016-04-22: 0.5 mL via INTRAMUSCULAR
  Filled 2016-04-22: qty 0.5

## 2016-04-22 MED ORDER — AMLODIPINE BESYLATE 5 MG PO TABS
5.0000 mg | ORAL_TABLET | Freq: Every day | ORAL | Status: DC
  Administered 2016-04-22 – 2016-04-23 (×2): 5 mg via ORAL
  Filled 2016-04-22 (×3): qty 1

## 2016-04-22 MED ORDER — ATORVASTATIN CALCIUM 10 MG PO TABS
10.0000 mg | ORAL_TABLET | Freq: Every day | ORAL | Status: DC
  Administered 2016-04-22 – 2016-04-23 (×2): 10 mg via ORAL
  Filled 2016-04-22 (×3): qty 1

## 2016-04-22 NOTE — ED Notes (Signed)
She remains awake, alert and articulate.  She is very pleased being able to be admitted to [facility]; and I thank them for the compliment.

## 2016-04-22 NOTE — ED Notes (Signed)
Dr. Darl Householder has just spoken with pt. And her family and has informed them of possible transfer to [facility]  Their preference is to stay here at [facility]--Dr. Darl Householder tells them that he will mention that to neurosurgeon, but it will be up to him, and he would let them know.

## 2016-04-22 NOTE — ED Notes (Signed)
Pt c/o posterior head injury/pain after a syncopal episode this morning.  Pain score 5/10.  Pt reports "I stood up after urinating and that's all I remember."  Hematoma and small laceration noted to posterior head.  Bleeding is controlled.  Denies blood thinners.  Pt's husband reports Pt hit her head on the bathtub.

## 2016-04-22 NOTE — ED Notes (Signed)
As I entered her room to take her to [address], the hospitalist came in to examine her.

## 2016-04-22 NOTE — Progress Notes (Signed)
Echocardiogram 2D Echocardiogram has been performed.  Tresa Res 04/22/2016, 3:54 PM

## 2016-04-22 NOTE — ED Provider Notes (Signed)
CSN: [id number]     Arrival date & time 04/22/16  0912 History   First MD Initiated Contact with Patient 04/22/16 0920     Chief Complaint  Patient presents with  . Loss of Consciousness  . Head Injury     (Consider location/radiation/quality/duration/timing/severity/associated sxs/prior Treatment) The history is provided by the patient.  Karen Pearson is a 82 y.o. female hx of melanoma s/p resection, HTN, hypothyroidism Presenting with syncope. Patient states that she was urinating on the commode and stood up and passed out. He remembered waking up on the floor. Husband saw her on the floor and thought that she may hit her head on the bathtub. He noticed some small amount bleeding from the posterior scalp that was controlled and and swelling on the back of her head. Denies being on blood thinners, takes motrin as needed. Denies cardiac history. Tdap not up to date.     Past Medical History  Diagnosis Date  . Cancer ([place])   . Melanoma ([place])   . Hypertension   . High cholesterol   . Thyroid disease    Past Surgical History  Procedure Laterality Date  . Abdominal hysterectomy    . Eye muscle surgery     No family history on file. Social History  Substance Use Topics  . Smoking status: None  . Smokeless tobacco: None  . Alcohol Use: None   OB History    No data available     Review of Systems  Cardiovascular: Positive for syncope.  Neurological: Positive for syncope.  All other systems reviewed and are negative.     Allergies  Review of patient's allergies indicates no known allergies.  Home Medications   Prior to Admission medications   Medication Sig Start Date End Date Taking? Authorizing Provider  amLODipine (NORVASC) 5 MG tablet Take 5 mg by mouth daily. 03/31/16  Yes Historical Provider, MD  atorvastatin (LIPITOR) 10 MG tablet Take 10 mg by mouth daily. 03/28/16  Yes Historical Provider, MD  levothyroxine (SYNTHROID, LEVOTHROID) 75 MCG tablet Take 75 mcg by mouth  daily before breakfast.   Yes Historical Provider, MD  Multiple Vitamins-Minerals (ICAPS AREDS 2 PO) Take 1 tablet by mouth daily.   Yes Historical Provider, MD  naproxen sodium (ANAPROX) 220 MG tablet Take 220 mg by mouth at bedtime.   Yes Historical Provider, MD  Vitamin D, Ergocalciferol, (DRISDOL) 50000 units CAPS capsule Take 50,000 Units by mouth once a week. 02/16/16  Yes Historical Provider, MD   BP 145/70 mmHg  Pulse 90  Temp(Src) 98.1 F (36.7 C) (Oral)  Resp 16  SpO2 99% Physical Exam  Constitutional: She is oriented to person, place, and time. She appears well-developed and well-nourished.  HENT:  Head: Normocephalic.  Large posterior scalp hematoma, small abrasion, no obvious laceration   Eyes: Conjunctivae are normal. Pupils are equal, round, and reactive to light.  Neck:  Scalp hematoma extending to upper neck. Nl ROM neck   Cardiovascular: Normal rate, regular rhythm and normal heart sounds.   Pulmonary/Chest: Effort normal and breath sounds normal. No respiratory distress. She has no wheezes. She has no rales.  Abdominal: Soft. Bowel sounds are normal. She exhibits no distension. There is no tenderness. There is no rebound.  Musculoskeletal:  Pelvis stable, nl ROM bilateral hips. No obvious spinal or extremity trauma   Neurological: She is alert and oriented to person, place, and time. No cranial nerve deficit. Coordination normal.  Skin: Skin is warm and dry.  Psychiatric: She  has a normal mood and affect. Her behavior is normal. Judgment and thought content normal.  Nursing note and vitals reviewed.   ED Course  Procedures (including critical care time)  CRITICAL CARE Performed by: Darl Householder, Masiyah Jorstad   Total critical care time: 30 minutes  Critical care time was exclusive of separately billable procedures and treating other patients.  Critical care was necessary to treat or prevent imminent or life-threatening deterioration.  Critical care was time spent  personally by me on the following activities: development of treatment plan with patient and/or surrogate as well as nursing, discussions with consultants, evaluation of patient's response to treatment, examination of patient, obtaining history from patient or surrogate, ordering and performing treatments and interventions, ordering and review of laboratory studies, ordering and review of radiographic studies, pulse oximetry and re-evaluation of patient's condition.   Labs Review Labs Reviewed  CBC WITH DIFFERENTIAL/PLATELET - Abnormal; Notable for the following:    Hemoglobin 11.8 (*)    All other components within normal limits  COMPREHENSIVE METABOLIC PANEL  I-STAT TROPOININ, ED  CBG MONITORING, ED    Imaging Review Ct Head Wo Contrast  04/22/2016  CLINICAL DATA:  Fall. EXAM: CT HEAD WITHOUT CONTRAST CT CERVICAL SPINE WITHOUT CONTRAST TECHNIQUE: Multidetector CT imaging of the head and cervical spine was performed following the standard protocol without intravenous contrast. Multiplanar CT image reconstructions of the cervical spine were also generated. COMPARISON:  04/13/2010 head and cervical spine CT. FINDINGS: CT HEAD FINDINGS Medial right occipital scalp hematoma measuring 4.8 x 2.5 cm. Small left frontal hemorrhagic cortical contusion with adjacent small amount of acute subarachnoid hemorrhage in the left frontal sulci. No evidence of a subdural hematoma. No intraventricular hemorrhage. No midline shift. No CT evidence of acute infarction. Intracranial atherosclerosis. Nonspecific moderate subcortical and periventricular white matter hypodensity, most in keeping with chronic small vessel ischemic change. Cerebral volume is age appropriate. No ventriculomegaly. The visualized paranasal sinuses are essentially clear. The mastoid air cells are unopacified. No evidence of calvarial fracture. CT CERVICAL SPINE FINDINGS Partially visualized right occipital scalp hematoma. No fracture is detected in  the cervical spine. No prevertebral soft tissue swelling. Normal cervical lordosis. Dens is well positioned between the lateral masses of C1. The lateral masses appear well-aligned. Moderate degenerative disc disease in the lower cervical spine, most prominent at C6-7. Severe bilateral facet arthropathy. Moderate foraminal stenosis on the left at C5-6. Mild foraminal stenosis on the left at C6-7. Minimal 2 mm anterolisthesis at C5-6. Visualized mastoid air cells appear clear. No evidence of intra-axial hemorrhage in the visualized brain. No gross cervical canal hematoma. No significant pulmonary nodules at the visualized lung apices. Scattered calcifications within the thyroid gland, without discrete thyroid nodule. No cervical adenopathy or other significant neck soft tissue abnormality. IMPRESSION: 1. Right occipital scalp hematoma. 2. Small left frontal hemorrhagic cortical contusion with associated small amount of acute subarachnoid hemorrhage in the left frontal sulci, in a contra-coup pattern. No significant mass-effect. No midline shift. No hydrocephalus. 3. No evidence of calvarial fracture. 4. No cervical spine fracture. 5. Minimal 2 mm anterolisthesis at C5-6, probably degenerative. 6. Moderate chronic small vessel ischemia. 7. Moderate degenerative changes in the cervical spine as described. Critical Value/emergent results were called by telephone at the time of interpretation on 04/22/2016 at 10:34 am to Dr. Shirlyn Goltz , who verbally acknowledged these results. Electronically Signed   By: Ilona Sorrel M.D.   On: 04/22/2016 10:36   Ct Cervical Spine Wo Contrast  04/22/2016  CLINICAL  DATA:  Fall. EXAM: CT HEAD WITHOUT CONTRAST CT CERVICAL SPINE WITHOUT CONTRAST TECHNIQUE: Multidetector CT imaging of the head and cervical spine was performed following the standard protocol without intravenous contrast. Multiplanar CT image reconstructions of the cervical spine were also generated. COMPARISON:  04/13/2010  head and cervical spine CT. FINDINGS: CT HEAD FINDINGS Medial right occipital scalp hematoma measuring 4.8 x 2.5 cm. Small left frontal hemorrhagic cortical contusion with adjacent small amount of acute subarachnoid hemorrhage in the left frontal sulci. No evidence of a subdural hematoma. No intraventricular hemorrhage. No midline shift. No CT evidence of acute infarction. Intracranial atherosclerosis. Nonspecific moderate subcortical and periventricular white matter hypodensity, most in keeping with chronic small vessel ischemic change. Cerebral volume is age appropriate. No ventriculomegaly. The visualized paranasal sinuses are essentially clear. The mastoid air cells are unopacified. No evidence of calvarial fracture. CT CERVICAL SPINE FINDINGS Partially visualized right occipital scalp hematoma. No fracture is detected in the cervical spine. No prevertebral soft tissue swelling. Normal cervical lordosis. Dens is well positioned between the lateral masses of C1. The lateral masses appear well-aligned. Moderate degenerative disc disease in the lower cervical spine, most prominent at C6-7. Severe bilateral facet arthropathy. Moderate foraminal stenosis on the left at C5-6. Mild foraminal stenosis on the left at C6-7. Minimal 2 mm anterolisthesis at C5-6. Visualized mastoid air cells appear clear. No evidence of intra-axial hemorrhage in the visualized brain. No gross cervical canal hematoma. No significant pulmonary nodules at the visualized lung apices. Scattered calcifications within the thyroid gland, without discrete thyroid nodule. No cervical adenopathy or other significant neck soft tissue abnormality. IMPRESSION: 1. Right occipital scalp hematoma. 2. Small left frontal hemorrhagic cortical contusion with associated small amount of acute subarachnoid hemorrhage in the left frontal sulci, in a contra-coup pattern. No significant mass-effect. No midline shift. No hydrocephalus. 3. No evidence of calvarial  fracture. 4. No cervical spine fracture. 5. Minimal 2 mm anterolisthesis at C5-6, probably degenerative. 6. Moderate chronic small vessel ischemia. 7. Moderate degenerative changes in the cervical spine as described. Critical Value/emergent results were called by telephone at the time of interpretation on 04/22/2016 at 10:34 am to Dr. Shirlyn Goltz , who verbally acknowledged these results. Electronically Signed   By: Ilona Sorrel M.D.   On: 04/22/2016 10:36   I have personally reviewed and evaluated these images and lab results as part of my medical decision-making.   EKG Interpretation   Date/Time:  Saturday April 22 2016 09:21:11 EDT Ventricular Rate:  91 PR Interval:    QRS Duration: 91 QT Interval:  380 QTC Calculation: 468 R Axis:   7 Text Interpretation:  Sinus rhythm Atrial premature complexes Probable  left ventricular hypertrophy Borderline T abnormalities, anterior leads No  previous ECGs available Confirmed by Okla Qazi  MD, Demiyah Fischbach ([id number]) on 04/22/2016  9:29:01 AM      MDM   Final diagnoses:  None   Karen Pearson is a 82 y.o. female here with syncope, scalp hematoma. Not on blood thinners. Will get CT head/neck, will get labs, EKG, orthostatics.   11:39 AM CT head showed small frontal subarachnoid. I called Dr. Saintclair Halsted, who states that patient is stable so doesn't need to be transferred to [facility]. Has nl neuro exam. Doesn't want C collar. Recommend CT head in 12-24 hrs and if stable can be discharged. Hospitalist can call if she has changed in mental status, worsening progression of the subarachnoid.    Wandra Arthurs, MD 04/22/16 [phone]

## 2016-04-22 NOTE — H&P (Signed)
History and Physical  Karen Pearson [id number] DOB: 1925-07-11 DOA: 04/22/2016  PCP:  No primary care provider on file.   Chief Complaint:  Syncope   History of Present Illness:  Patient is a 82 yo female with history of HTN and hypothyroidism who came with cc of syncope.As she was on the commode this morning she blacked out and fell backwards. The husband heard the sound and found her in the bathtub unconscious ( for less than a min).she responded to him and he noticed a hematoma on the back of her head. The patient denied any chest pain/dyspnea/cough prior or after the episode. No dizziness/vertgio.No N/v/D/C/abd pain/dysuria. No palpitations. No hx of syncope, seizures, stroke, MI.   Review of Systems:  CONSTITUTIONAL:     No night sweats.  No fatigue.  No fever. No chills. Eyes:                            No visual changes.  No eye pain.  No eye discharge.   ENT:                              No epistaxis.  No sinus pain.  No sore throat.   No congestion. RESPIRATORY:           No cough.  No wheeze.  No hemoptysis.  No dyspnea CARDIOVASCULAR   :  No chest pains.  No palpitations. GASTROINTESTINAL:  No abdominal pain.  No nausea. No vomiting.  No diarrhea. No  constipation.  No hematemesis.  No hematochezia.  No melena. GENITOURINARY:      No urgency.  No frequency.  No dysuria.  No hematuria.  No  obstructive symptoms.  No discharge.  No pain.  MUSCULOSKELETAL:  +musculoskeletal pain.  No joint swelling.  No arthritis. NEUROLOGICAL:        No confusion.  No weakness. No headache. No seizure. PSYCHIATRIC:             No depression. No anxiety. No suicidal ideation. SKIN:                             No rashes.  No lesions.  No wounds. ENDOCRINE:                No weight loss.  No polydipsia.  No polyuria.  No polyphagia. HEMATOLOGIC:           No purpura.  No petechiae.  +bleeding.  ALLERGIC                 : No pruritus.  No angioedema Other:  Past Medical and Surgical  History:   Past Medical History  Diagnosis Date  . Cancer ([place])   . Melanoma ([place])   . Hypertension   . High cholesterol   . Thyroid disease    Past Surgical History  Procedure Laterality Date  . Abdominal hysterectomy    . Eye muscle surgery      Social History:   reports that she has never smoked. She does not have any smokeless tobacco history on file. She reports that she does not drink alcohol. Her drug history is not on file.    No Known Allergies  No family history on file.    Prior to Admission medications   Medication Sig Start Date End Date Taking? Authorizing Provider  amLODipine (NORVASC) 5 MG tablet Take 5 mg by mouth daily. 03/31/16  Yes Historical Provider, MD  atorvastatin (LIPITOR) 10 MG tablet Take 10 mg by mouth daily. 03/28/16  Yes Historical Provider, MD  levothyroxine (SYNTHROID, LEVOTHROID) 75 MCG tablet Take 75 mcg by mouth daily before breakfast.   Yes Historical Provider, MD  Multiple Vitamins-Minerals (ICAPS AREDS 2 PO) Take 1 tablet by mouth daily.   Yes Historical Provider, MD  naproxen sodium (ANAPROX) 220 MG tablet Take 220 mg by mouth at bedtime.   Yes Historical Provider, MD  Vitamin D, Ergocalciferol, (DRISDOL) 50000 units CAPS capsule Take 50,000 Units by mouth once a week. 02/16/16  Yes Historical Provider, MD    Physical Exam: BP 176/65 mmHg  Pulse 91  Temp(Src) 98.1 F (36.7 C) (Oral)  Resp 15  SpO2 100%  GENERAL :   Alert and cooperative, and appears to be in no acute distress. HEAD:           Normocephalic. Hematoma on the occipital area.  EYES:            PERRL, EOMI.  vision is grossly intact. EARS:           hearing grossly intact. NOSE:           No nasal discharge. THROAT:     Oral cavity and pharynx normal.   NECK:          supple, non-tender. CARDIAC:    Normal S1 and S2. No gallop. No murmurs.  Vascular:     no peripheral edema.  LUNGS:       Clear to auscultation  ABDOMEN: Positive bowel sounds. Soft, nondistended,  nontender.      MSK:           No joint erythema or tenderness.  EXT           : No significant deformity or joint abnormality. Neuro        : Alert, oriented to person, place, and time.                      CN II-XII intact.                       SKIN:            No rash. No lesions. PSYCH:       No hallucination. Patient is not suicidal.          Labs on Admission:  Reviewed.   Radiological Exams on Admission: Ct Head Wo Contrast  04/22/2016  CLINICAL DATA:  Fall. EXAM: CT HEAD WITHOUT CONTRAST CT CERVICAL SPINE WITHOUT CONTRAST TECHNIQUE: Multidetector CT imaging of the head and cervical spine was performed following the standard protocol without intravenous contrast. Multiplanar CT image reconstructions of the cervical spine were also generated. COMPARISON:  04/13/2010 head and cervical spine CT. FINDINGS: CT HEAD FINDINGS Medial right occipital scalp hematoma measuring 4.8 x 2.5 cm. Small left frontal hemorrhagic cortical contusion with adjacent small amount of acute subarachnoid hemorrhage in the left frontal sulci. No evidence of a subdural hematoma. No intraventricular hemorrhage. No midline shift. No CT evidence of acute infarction. Intracranial atherosclerosis. Nonspecific moderate subcortical and periventricular white matter hypodensity, most in keeping with chronic small vessel ischemic change. Cerebral volume is age appropriate. No ventriculomegaly. The visualized paranasal sinuses are essentially clear. The mastoid air cells are unopacified. No evidence of calvarial fracture. CT CERVICAL SPINE FINDINGS Partially visualized right  occipital scalp hematoma. No fracture is detected in the cervical spine. No prevertebral soft tissue swelling. Normal cervical lordosis. Dens is well positioned between the lateral masses of C1. The lateral masses appear well-aligned. Moderate degenerative disc disease in the lower cervical spine, most prominent at C6-7. Severe bilateral facet arthropathy.  Moderate foraminal stenosis on the left at C5-6. Mild foraminal stenosis on the left at C6-7. Minimal 2 mm anterolisthesis at C5-6. Visualized mastoid air cells appear clear. No evidence of intra-axial hemorrhage in the visualized brain. No gross cervical canal hematoma. No significant pulmonary nodules at the visualized lung apices. Scattered calcifications within the thyroid gland, without discrete thyroid nodule. No cervical adenopathy or other significant neck soft tissue abnormality. IMPRESSION: 1. Right occipital scalp hematoma. 2. Small left frontal hemorrhagic cortical contusion with associated small amount of acute subarachnoid hemorrhage in the left frontal sulci, in a contra-coup pattern. No significant mass-effect. No midline shift. No hydrocephalus. 3. No evidence of calvarial fracture. 4. No cervical spine fracture. 5. Minimal 2 mm anterolisthesis at C5-6, probably degenerative. 6. Moderate chronic small vessel ischemia. 7. Moderate degenerative changes in the cervical spine as described. Critical Value/emergent results were called by telephone at the time of interpretation on 04/22/2016 at 10:34 am to Dr. Shirlyn Goltz , who verbally acknowledged these results. Electronically Signed   By: Ilona Sorrel M.D.   On: 04/22/2016 10:36   Ct Cervical Spine Wo Contrast  04/22/2016  CLINICAL DATA:  Fall. EXAM: CT HEAD WITHOUT CONTRAST CT CERVICAL SPINE WITHOUT CONTRAST TECHNIQUE: Multidetector CT imaging of the head and cervical spine was performed following the standard protocol without intravenous contrast. Multiplanar CT image reconstructions of the cervical spine were also generated. COMPARISON:  04/13/2010 head and cervical spine CT. FINDINGS: CT HEAD FINDINGS Medial right occipital scalp hematoma measuring 4.8 x 2.5 cm. Small left frontal hemorrhagic cortical contusion with adjacent small amount of acute subarachnoid hemorrhage in the left frontal sulci. No evidence of a subdural hematoma. No intraventricular  hemorrhage. No midline shift. No CT evidence of acute infarction. Intracranial atherosclerosis. Nonspecific moderate subcortical and periventricular white matter hypodensity, most in keeping with chronic small vessel ischemic change. Cerebral volume is age appropriate. No ventriculomegaly. The visualized paranasal sinuses are essentially clear. The mastoid air cells are unopacified. No evidence of calvarial fracture. CT CERVICAL SPINE FINDINGS Partially visualized right occipital scalp hematoma. No fracture is detected in the cervical spine. No prevertebral soft tissue swelling. Normal cervical lordosis. Dens is well positioned between the lateral masses of C1. The lateral masses appear well-aligned. Moderate degenerative disc disease in the lower cervical spine, most prominent at C6-7. Severe bilateral facet arthropathy. Moderate foraminal stenosis on the left at C5-6. Mild foraminal stenosis on the left at C6-7. Minimal 2 mm anterolisthesis at C5-6. Visualized mastoid air cells appear clear. No evidence of intra-axial hemorrhage in the visualized brain. No gross cervical canal hematoma. No significant pulmonary nodules at the visualized lung apices. Scattered calcifications within the thyroid gland, without discrete thyroid nodule. No cervical adenopathy or other significant neck soft tissue abnormality. IMPRESSION: 1. Right occipital scalp hematoma. 2. Small left frontal hemorrhagic cortical contusion with associated small amount of acute subarachnoid hemorrhage in the left frontal sulci, in a contra-coup pattern. No significant mass-effect. No midline shift. No hydrocephalus. 3. No evidence of calvarial fracture. 4. No cervical spine fracture. 5. Minimal 2 mm anterolisthesis at C5-6, probably degenerative. 6. Moderate chronic small vessel ischemia. 7. Moderate degenerative changes in the cervical spine as described. Critical  Value/emergent results were called by telephone at the time of interpretation on  04/22/2016 at 10:34 am to Dr. Shirlyn Goltz , who verbally acknowledged these results. Electronically Signed   By: Ilona Sorrel M.D.   On: 04/22/2016 10:36    EKG:  Independently reviewed. NSR  Assessment/Plan  Syncope:  DDx:  - Cardiac : structural heart disease ( AS, MS) or arrhythmia ( tachy or brady)    Get Echo. Keep on Tele. Initial EKG/Trop unremarkable with no CP.ACS unlikely. - syncope after changing position suggest orthostatic hypotension.   Check orthostatics  - Neurologic : seizure vs. Stroke vs. TIA    All unlikely per history.small amount of SAH: neurosurgery consulted:recs to monitor. May need workup for aneurysm. CT angio?     Will repeat CT wo contrast in 12 hours. Neurochecks Q4H. - Vasovagal syncope is possible.     IVF given in the ER.   HTN; cont Norvasc  Input & Output: NA Lines & Tubes: PIV DVT prophylaxis: SCDs GI prophylaxis:NA Consultants: Neurosurgery  Code Status: Full Family Communication: at bedside Disposition Plan: Tele    Gennaro Africa M.D [facility]

## 2016-04-23 ENCOUNTER — Encounter (HOSPITAL_COMMUNITY): Payer: Self-pay | Admitting: Radiology

## 2016-04-23 ENCOUNTER — Inpatient Hospital Stay (HOSPITAL_COMMUNITY): Payer: Medicare Other

## 2016-04-23 DIAGNOSIS — I609 Nontraumatic subarachnoid hemorrhage, unspecified: Secondary | ICD-10-CM

## 2016-04-23 DIAGNOSIS — E039 Hypothyroidism, unspecified: Secondary | ICD-10-CM

## 2016-04-23 DIAGNOSIS — R55 Syncope and collapse: Secondary | ICD-10-CM

## 2016-04-23 LAB — GLUCOSE, CAPILLARY: GLUCOSE-CAPILLARY: 93 mg/dL (ref 65–99)

## 2016-04-23 NOTE — Progress Notes (Signed)
Patient and her husband given discharge, follow up, and medication instructions, verbalized understanding, IV and telemetry box removed, family to transport home, all personal belongings with patient at time of d/c.

## 2016-04-23 NOTE — Discharge Summary (Signed)
Physician Discharge Summary  Karen Pearson [id number] DOB: [birth year] DOA: 04/22/2016  PCP: No primary care provider on file.  Admit date: 04/22/2016 Discharge date: 04/23/2016  Admitted From: home  Disposition:  home   Recommendations for Outpatient Follow-up:  1. F/u for orthostatic symptoms  Home Health:  none  Equipment/Devices:  none    Discharge Condition:  stable   CODE STATUS:  Full code   Diet recommendation:  Heart healthy Consultations:  none    Discharge Diagnoses:  Principal Problem:   Syncope Active Problems:   Subarachnoid bleed (HCC)   Hypothyroidism    Subjective: No pain, dizziness, headache, nausea, vomiting, diarrhea, cough, dyspnea.   Brief Summary: 82 y/o who is very active and hash HTN and hypothyroidism was sitting on the commode and then blacked out. Her husband remembers her crying out for help. He went in to check on her and found her on the floor next to the bathtub. She was awake by then and had a lump on the back of her head. He assisted her up and she was able to walk to the bedroom. She did not lose control of bowel or bladder and had no tongue biting. No other obvious injury. No chest pain, or neurological symptoms. She had not been straining to have a BM, she did not have a BM and has not been constipated. In the ER, orthostatic vitals were positive. Head CT showed a small left frontal subarachnoid hemorrhage small left frontal contusion. C spine CT showed mod degenerative changes.   Hospital Course:  Syncope - likely orthostatic - CT head as above - ECHO shows vigorous LV systolic function and grade 1 diastolic dysfunction - repeat orthostatics negative- able to ambulate without symptoms - EKG non specific t wave inversion in lead 3-  no arhythmia on telemetry - repeat CT show no increase in hemorrhage- have asked her to avoid NSAIDs-   Head contusion - bruises appears to be healing nicely- no discomfort  HTN -  norvasc  Hypothyroid - synthroid   Discharge Instructions      Discharge Instructions    Diet - low sodium heart healthy    Complete by:  As directed      Discharge instructions    Complete by:  As directed   Do not take any aspirin, Ibuprofen or Alleve for 2 wks Drink 6-8 glasses of water a day. Get up slowly from laying to a sitting position.     Increase activity slowly    Complete by:  As directed             Medication List    STOP taking these medications        naproxen sodium 220 MG tablet  Commonly known as:  ANAPROX      TAKE these medications        amLODipine 5 MG tablet  Commonly known as:  NORVASC  Take 5 mg by mouth daily.     atorvastatin 10 MG tablet  Commonly known as:  LIPITOR  Take 10 mg by mouth daily.     ICAPS AREDS 2 PO  Take 1 tablet by mouth daily.     levothyroxine 75 MCG tablet  Commonly known as:  SYNTHROID, LEVOTHROID  Take 75 mcg by mouth daily before breakfast.     Vitamin D (Ergocalciferol) 50000 units Caps capsule  Commonly known as:  DRISDOL  Take 50,000 Units by mouth once a week.        No Known  Allergies   Procedures/Studies:   Ct Head Wo Contrast  04/23/2016  CLINICAL DATA:  Follow-up subarachnoid hemorrhage EXAM: CT HEAD WITHOUT CONTRAST TECHNIQUE: Contiguous axial images were obtained from the base of the skull through the vertex without intravenous contrast. COMPARISON:  04/22/2016 FINDINGS: Bony calvarium is intact. Persistent right occipital scalp hematoma is noted. Minimal subarachnoid hemorrhage is again seen near the vertex on the left stable from the prior study. No new areas of hemorrhage are seen. No intraventricular component is noted. Mild atrophic changes are again seen. IMPRESSION: Stable appearance of the subarachnoid hemorrhage when compared with the prior day. Electronically Signed   By: Inez Catalina M.D.   On: 04/23/2016 10:12   Ct Head Wo Contrast  04/22/2016  CLINICAL DATA:  Head injury.   Follow-up intracranial hemorrhage EXAM: CT HEAD WITHOUT CONTRAST TECHNIQUE: Contiguous axial images were obtained from the base of the skull through the vertex without intravenous contrast. COMPARISON:  CT head from earlier today FINDINGS: Small amount of subarachnoid hemorrhage in the left frontal region is similar in size. No new area of hemorrhage. No subdural hematoma Atrophy and chronic microvascular ischemic changes. No acute infarct or mass Right occipital scalp hematoma.  Negative for skull fracture IMPRESSION: Small volume subarachnoid hemorrhage left frontal region unchanged. No change since earlier today. Electronically Signed   By: Franchot Gallo M.D.   On: 04/22/2016 19:03   Ct Head Wo Contrast  04/22/2016  CLINICAL DATA:  Fall. EXAM: CT HEAD WITHOUT CONTRAST CT CERVICAL SPINE WITHOUT CONTRAST TECHNIQUE: Multidetector CT imaging of the head and cervical spine was performed following the standard protocol without intravenous contrast. Multiplanar CT image reconstructions of the cervical spine were also generated. COMPARISON:  04/13/2010 head and cervical spine CT. FINDINGS: CT HEAD FINDINGS Medial right occipital scalp hematoma measuring 4.8 x 2.5 cm. Small left frontal hemorrhagic cortical contusion with adjacent small amount of acute subarachnoid hemorrhage in the left frontal sulci. No evidence of a subdural hematoma. No intraventricular hemorrhage. No midline shift. No CT evidence of acute infarction. Intracranial atherosclerosis. Nonspecific moderate subcortical and periventricular white matter hypodensity, most in keeping with chronic small vessel ischemic change. Cerebral volume is age appropriate. No ventriculomegaly. The visualized paranasal sinuses are essentially clear. The mastoid air cells are unopacified. No evidence of calvarial fracture. CT CERVICAL SPINE FINDINGS Partially visualized right occipital scalp hematoma. No fracture is detected in the cervical spine. No prevertebral soft  tissue swelling. Normal cervical lordosis. Dens is well positioned between the lateral masses of C1. The lateral masses appear well-aligned. Moderate degenerative disc disease in the lower cervical spine, most prominent at C6-7. Severe bilateral facet arthropathy. Moderate foraminal stenosis on the left at C5-6. Mild foraminal stenosis on the left at C6-7. Minimal 2 mm anterolisthesis at C5-6. Visualized mastoid air cells appear clear. No evidence of intra-axial hemorrhage in the visualized brain. No gross cervical canal hematoma. No significant pulmonary nodules at the visualized lung apices. Scattered calcifications within the thyroid gland, without discrete thyroid nodule. No cervical adenopathy or other significant neck soft tissue abnormality. IMPRESSION: 1. Right occipital scalp hematoma. 2. Small left frontal hemorrhagic cortical contusion with associated small amount of acute subarachnoid hemorrhage in the left frontal sulci, in a contra-coup pattern. No significant mass-effect. No midline shift. No hydrocephalus. 3. No evidence of calvarial fracture. 4. No cervical spine fracture. 5. Minimal 2 mm anterolisthesis at C5-6, probably degenerative. 6. Moderate chronic small vessel ischemia. 7. Moderate degenerative changes in the cervical spine as described. Critical  Value/emergent results were called by telephone at the time of interpretation on 04/22/2016 at 10:34 am to Dr. Shirlyn Goltz , who verbally acknowledged these results. Electronically Signed   By: Ilona Sorrel M.D.   On: 04/22/2016 10:36   Ct Cervical Spine Wo Contrast  04/22/2016  CLINICAL DATA:  Fall. EXAM: CT HEAD WITHOUT CONTRAST CT CERVICAL SPINE WITHOUT CONTRAST TECHNIQUE: Multidetector CT imaging of the head and cervical spine was performed following the standard protocol without intravenous contrast. Multiplanar CT image reconstructions of the cervical spine were also generated. COMPARISON:  04/13/2010 head and cervical spine CT. FINDINGS: CT  HEAD FINDINGS Medial right occipital scalp hematoma measuring 4.8 x 2.5 cm. Small left frontal hemorrhagic cortical contusion with adjacent small amount of acute subarachnoid hemorrhage in the left frontal sulci. No evidence of a subdural hematoma. No intraventricular hemorrhage. No midline shift. No CT evidence of acute infarction. Intracranial atherosclerosis. Nonspecific moderate subcortical and periventricular white matter hypodensity, most in keeping with chronic small vessel ischemic change. Cerebral volume is age appropriate. No ventriculomegaly. The visualized paranasal sinuses are essentially clear. The mastoid air cells are unopacified. No evidence of calvarial fracture. CT CERVICAL SPINE FINDINGS Partially visualized right occipital scalp hematoma. No fracture is detected in the cervical spine. No prevertebral soft tissue swelling. Normal cervical lordosis. Dens is well positioned between the lateral masses of C1. The lateral masses appear well-aligned. Moderate degenerative disc disease in the lower cervical spine, most prominent at C6-7. Severe bilateral facet arthropathy. Moderate foraminal stenosis on the left at C5-6. Mild foraminal stenosis on the left at C6-7. Minimal 2 mm anterolisthesis at C5-6. Visualized mastoid air cells appear clear. No evidence of intra-axial hemorrhage in the visualized brain. No gross cervical canal hematoma. No significant pulmonary nodules at the visualized lung apices. Scattered calcifications within the thyroid gland, without discrete thyroid nodule. No cervical adenopathy or other significant neck soft tissue abnormality. IMPRESSION: 1. Right occipital scalp hematoma. 2. Small left frontal hemorrhagic cortical contusion with associated small amount of acute subarachnoid hemorrhage in the left frontal sulci, in a contra-coup pattern. No significant mass-effect. No midline shift. No hydrocephalus. 3. No evidence of calvarial fracture. 4. No cervical spine fracture. 5.  Minimal 2 mm anterolisthesis at C5-6, probably degenerative. 6. Moderate chronic small vessel ischemia. 7. Moderate degenerative changes in the cervical spine as described. Critical Value/emergent results were called by telephone at the time of interpretation on 04/22/2016 at 10:34 am to Dr. Shirlyn Goltz , who verbally acknowledged these results. Electronically Signed   By: Ilona Sorrel M.D.   On: 04/22/2016 10:36       Discharge Exam: Filed Vitals:   04/23/16 0507 04/23/16 1046  BP: 143/45 162/64  Pulse: 74 72  Temp: 98.3 F (36.8 C)   Resp: 20    Filed Vitals:   04/23/16 0132 04/23/16 0507 04/23/16 1045 04/23/16 1046  BP: 145/50 143/45  162/64  Pulse: 71 74  72  Temp: 98.5 F (36.9 C) 98.3 F (36.8 C)    TempSrc: Oral Oral    Resp: 20 20    Height:      Weight:  57.063 kg (125 lb 12.8 oz)    SpO2: 98% 100% 100%     General: Pt is alert, awake, not in acute distress Cardiovascular: RRR, S1/S2 +, no rubs, no gallops Respiratory: CTA bilaterally, no wheezing, no rhonchi Abdominal: Soft, NT, ND, bowel sounds + Extremities: no edema, no cyanosis    The results of significant diagnostics from this  hospitalization (including imaging, microbiology, ancillary and laboratory) are listed below for reference.     Microbiology: No results found for this or any previous visit (from the past 240 hour(s)).   Labs: BNP (last 3 results) No results for input(s): BNP in the last 8760 hours. Basic Metabolic Panel:  Recent Labs Lab 04/22/16 0940  NA 141  K 4.3  CL 106  CO2 29  GLUCOSE 99  BUN 19  CREATININE 0.81  CALCIUM 9.0   Liver Function Tests:  Recent Labs Lab 04/22/16 0940  AST 23  ALT 16  ALKPHOS 64  BILITOT 0.4  PROT 6.9  ALBUMIN 4.1   No results for input(s): LIPASE, AMYLASE in the last 168 hours. No results for input(s): AMMONIA in the last 168 hours. CBC:  Recent Labs Lab 04/22/16 0940  WBC 7.0  NEUTROABS 5.3  HGB 11.8*  HCT 36.1  MCV 92.6  PLT 223    Cardiac Enzymes: No results for input(s): CKTOTAL, CKMB, CKMBINDEX, TROPONINI in the last 168 hours. BNP: Invalid input(s): POCBNP CBG:  Recent Labs Lab 04/22/16 0924 04/23/16 0800  GLUCAP 84 93   D-Dimer No results for input(s): DDIMER in the last 72 hours. Hgb A1c No results for input(s): HGBA1C in the last 72 hours. Lipid Profile No results for input(s): CHOL, HDL, LDLCALC, TRIG, CHOLHDL, LDLDIRECT in the last 72 hours. Thyroid function studies No results for input(s): TSH, T4TOTAL, T3FREE, THYROIDAB in the last 72 hours.  Invalid input(s): FREET3 Anemia work up No results for input(s): VITAMINB12, FOLATE, FERRITIN, TIBC, IRON, RETICCTPCT in the last 72 hours. Urinalysis No results found for: COLORURINE, APPEARANCEUR, LABSPEC, [place], GLUCOSEU, HGBUR, BILIRUBINUR, KETONESUR, PROTEINUR, UROBILINOGEN, NITRITE, LEUKOCYTESUR Sepsis Labs Invalid input(s): PROCALCITONIN,  WBC,  LACTICIDVEN Microbiology No results found for this or any previous visit (from the past 240 hour(s)).   Time coordinating discharge: Over 30 minutes  SIGNED:   Debbe Odea, MD  [facility] 04/23/2016, 5:42 PM Pager   If 7PM-7AM, please contact night-coverage [email or web address] Password [email or web address]

## 2016-05-02 DIAGNOSIS — H2512 Age-related nuclear cataract, left eye: Secondary | ICD-10-CM | POA: Diagnosis not present

## 2016-05-02 DIAGNOSIS — Z961 Presence of intraocular lens: Secondary | ICD-10-CM | POA: Diagnosis not present

## 2016-05-02 DIAGNOSIS — H353212 Exudative age-related macular degeneration, right eye, with inactive choroidal neovascularization: Secondary | ICD-10-CM | POA: Diagnosis not present

## 2016-05-02 DIAGNOSIS — H02054 Trichiasis without entropian left upper eyelid: Secondary | ICD-10-CM | POA: Diagnosis not present

## 2016-05-02 DIAGNOSIS — H353120 Nonexudative age-related macular degeneration, left eye, stage unspecified: Secondary | ICD-10-CM | POA: Diagnosis not present

## 2016-05-02 DIAGNOSIS — H25012 Cortical age-related cataract, left eye: Secondary | ICD-10-CM | POA: Diagnosis not present

## 2016-05-05 DIAGNOSIS — E785 Hyperlipidemia, unspecified: Secondary | ICD-10-CM | POA: Diagnosis not present

## 2016-05-05 DIAGNOSIS — I10 Essential (primary) hypertension: Secondary | ICD-10-CM | POA: Diagnosis not present

## 2016-05-05 DIAGNOSIS — E039 Hypothyroidism, unspecified: Secondary | ICD-10-CM | POA: Diagnosis not present

## 2016-05-05 DIAGNOSIS — G479 Sleep disorder, unspecified: Secondary | ICD-10-CM | POA: Diagnosis not present

## 2016-05-05 DIAGNOSIS — R2689 Other abnormalities of gait and mobility: Secondary | ICD-10-CM | POA: Diagnosis not present

## 2016-05-05 DIAGNOSIS — B0229 Other postherpetic nervous system involvement: Secondary | ICD-10-CM | POA: Diagnosis not present

## 2016-05-05 DIAGNOSIS — K219 Gastro-esophageal reflux disease without esophagitis: Secondary | ICD-10-CM | POA: Diagnosis not present

## 2016-05-05 DIAGNOSIS — E559 Vitamin D deficiency, unspecified: Secondary | ICD-10-CM | POA: Diagnosis not present

## 2016-05-17 DIAGNOSIS — H353120 Nonexudative age-related macular degeneration, left eye, stage unspecified: Secondary | ICD-10-CM | POA: Diagnosis not present

## 2016-05-17 DIAGNOSIS — H25012 Cortical age-related cataract, left eye: Secondary | ICD-10-CM | POA: Diagnosis not present

## 2016-05-17 DIAGNOSIS — H04123 Dry eye syndrome of bilateral lacrimal glands: Secondary | ICD-10-CM | POA: Diagnosis not present

## 2016-05-17 DIAGNOSIS — H2512 Age-related nuclear cataract, left eye: Secondary | ICD-10-CM | POA: Diagnosis not present

## 2016-06-07 DIAGNOSIS — H25812 Combined forms of age-related cataract, left eye: Secondary | ICD-10-CM | POA: Diagnosis not present

## 2016-06-07 DIAGNOSIS — H25012 Cortical age-related cataract, left eye: Secondary | ICD-10-CM | POA: Diagnosis not present

## 2016-06-07 DIAGNOSIS — H2512 Age-related nuclear cataract, left eye: Secondary | ICD-10-CM | POA: Diagnosis not present

## 2016-06-14 ENCOUNTER — Encounter (INDEPENDENT_AMBULATORY_CARE_PROVIDER_SITE_OTHER): Payer: Medicare Other | Admitting: Ophthalmology

## 2016-06-21 ENCOUNTER — Encounter (INDEPENDENT_AMBULATORY_CARE_PROVIDER_SITE_OTHER): Payer: Medicare Other | Admitting: Ophthalmology

## 2016-06-21 DIAGNOSIS — I10 Essential (primary) hypertension: Secondary | ICD-10-CM | POA: Diagnosis not present

## 2016-06-21 DIAGNOSIS — H353124 Nonexudative age-related macular degeneration, left eye, advanced atrophic with subfoveal involvement: Secondary | ICD-10-CM | POA: Diagnosis not present

## 2016-06-21 DIAGNOSIS — H43813 Vitreous degeneration, bilateral: Secondary | ICD-10-CM | POA: Diagnosis not present

## 2016-06-21 DIAGNOSIS — H353211 Exudative age-related macular degeneration, right eye, with active choroidal neovascularization: Secondary | ICD-10-CM | POA: Diagnosis not present

## 2016-06-21 DIAGNOSIS — H35033 Hypertensive retinopathy, bilateral: Secondary | ICD-10-CM

## 2016-07-07 DIAGNOSIS — H02054 Trichiasis without entropian left upper eyelid: Secondary | ICD-10-CM | POA: Diagnosis not present

## 2016-07-13 DIAGNOSIS — Z23 Encounter for immunization: Secondary | ICD-10-CM | POA: Diagnosis not present

## 2016-07-13 DIAGNOSIS — Z1231 Encounter for screening mammogram for malignant neoplasm of breast: Secondary | ICD-10-CM | POA: Diagnosis not present

## 2016-08-14 DIAGNOSIS — K219 Gastro-esophageal reflux disease without esophagitis: Secondary | ICD-10-CM | POA: Diagnosis not present

## 2016-08-14 DIAGNOSIS — M899 Disorder of bone, unspecified: Secondary | ICD-10-CM | POA: Diagnosis not present

## 2016-08-14 DIAGNOSIS — B029 Zoster without complications: Secondary | ICD-10-CM | POA: Diagnosis not present

## 2016-08-14 DIAGNOSIS — E039 Hypothyroidism, unspecified: Secondary | ICD-10-CM | POA: Diagnosis not present

## 2016-08-14 DIAGNOSIS — E785 Hyperlipidemia, unspecified: Secondary | ICD-10-CM | POA: Diagnosis not present

## 2016-08-14 DIAGNOSIS — I10 Essential (primary) hypertension: Secondary | ICD-10-CM | POA: Diagnosis not present

## 2016-08-14 DIAGNOSIS — B0229 Other postherpetic nervous system involvement: Secondary | ICD-10-CM | POA: Diagnosis not present

## 2016-08-14 DIAGNOSIS — E7800 Pure hypercholesterolemia, unspecified: Secondary | ICD-10-CM | POA: Diagnosis not present

## 2016-08-14 DIAGNOSIS — M85859 Other specified disorders of bone density and structure, unspecified thigh: Secondary | ICD-10-CM | POA: Diagnosis not present

## 2016-08-14 DIAGNOSIS — E559 Vitamin D deficiency, unspecified: Secondary | ICD-10-CM | POA: Diagnosis not present

## 2016-08-14 DIAGNOSIS — R2689 Other abnormalities of gait and mobility: Secondary | ICD-10-CM | POA: Diagnosis not present

## 2016-08-14 DIAGNOSIS — Z23 Encounter for immunization: Secondary | ICD-10-CM | POA: Diagnosis not present

## 2016-08-16 DIAGNOSIS — B0229 Other postherpetic nervous system involvement: Secondary | ICD-10-CM | POA: Diagnosis not present

## 2016-08-16 DIAGNOSIS — G479 Sleep disorder, unspecified: Secondary | ICD-10-CM | POA: Diagnosis not present

## 2016-08-16 DIAGNOSIS — E785 Hyperlipidemia, unspecified: Secondary | ICD-10-CM | POA: Diagnosis not present

## 2016-08-16 DIAGNOSIS — I10 Essential (primary) hypertension: Secondary | ICD-10-CM | POA: Diagnosis not present

## 2016-08-16 DIAGNOSIS — Z01419 Encounter for gynecological examination (general) (routine) without abnormal findings: Secondary | ICD-10-CM | POA: Diagnosis not present

## 2016-08-16 DIAGNOSIS — Z0000 Encounter for general adult medical examination without abnormal findings: Secondary | ICD-10-CM | POA: Diagnosis not present

## 2016-08-16 DIAGNOSIS — E039 Hypothyroidism, unspecified: Secondary | ICD-10-CM | POA: Diagnosis not present

## 2016-08-16 DIAGNOSIS — K219 Gastro-esophageal reflux disease without esophagitis: Secondary | ICD-10-CM | POA: Diagnosis not present

## 2016-08-16 DIAGNOSIS — Z1389 Encounter for screening for other disorder: Secondary | ICD-10-CM | POA: Diagnosis not present

## 2016-08-16 DIAGNOSIS — M85852 Other specified disorders of bone density and structure, left thigh: Secondary | ICD-10-CM | POA: Diagnosis not present

## 2016-09-01 DIAGNOSIS — H02054 Trichiasis without entropian left upper eyelid: Secondary | ICD-10-CM | POA: Diagnosis not present

## 2016-09-06 DIAGNOSIS — M8588 Other specified disorders of bone density and structure, other site: Secondary | ICD-10-CM | POA: Diagnosis not present

## 2016-09-06 DIAGNOSIS — M85852 Other specified disorders of bone density and structure, left thigh: Secondary | ICD-10-CM | POA: Diagnosis not present

## 2016-10-03 DIAGNOSIS — L905 Scar conditions and fibrosis of skin: Secondary | ICD-10-CM | POA: Diagnosis not present

## 2016-10-04 ENCOUNTER — Encounter (INDEPENDENT_AMBULATORY_CARE_PROVIDER_SITE_OTHER): Payer: Medicare Other | Admitting: Ophthalmology

## 2016-10-04 DIAGNOSIS — H43813 Vitreous degeneration, bilateral: Secondary | ICD-10-CM | POA: Diagnosis not present

## 2016-10-04 DIAGNOSIS — H353211 Exudative age-related macular degeneration, right eye, with active choroidal neovascularization: Secondary | ICD-10-CM | POA: Diagnosis not present

## 2016-10-04 DIAGNOSIS — H35033 Hypertensive retinopathy, bilateral: Secondary | ICD-10-CM | POA: Diagnosis not present

## 2016-10-04 DIAGNOSIS — H353124 Nonexudative age-related macular degeneration, left eye, advanced atrophic with subfoveal involvement: Secondary | ICD-10-CM

## 2016-10-04 DIAGNOSIS — I10 Essential (primary) hypertension: Secondary | ICD-10-CM | POA: Diagnosis not present

## 2016-11-23 DIAGNOSIS — H04123 Dry eye syndrome of bilateral lacrimal glands: Secondary | ICD-10-CM | POA: Diagnosis not present

## 2016-11-23 DIAGNOSIS — B0230 Zoster ocular disease, unspecified: Secondary | ICD-10-CM | POA: Diagnosis not present

## 2016-11-23 DIAGNOSIS — H01003 Unspecified blepharitis right eye, unspecified eyelid: Secondary | ICD-10-CM | POA: Diagnosis not present

## 2016-11-23 DIAGNOSIS — H02054 Trichiasis without entropian left upper eyelid: Secondary | ICD-10-CM | POA: Diagnosis not present

## 2017-01-04 DIAGNOSIS — Z961 Presence of intraocular lens: Secondary | ICD-10-CM | POA: Diagnosis not present

## 2017-01-04 DIAGNOSIS — H35033 Hypertensive retinopathy, bilateral: Secondary | ICD-10-CM | POA: Diagnosis not present

## 2017-01-04 DIAGNOSIS — H353212 Exudative age-related macular degeneration, right eye, with inactive choroidal neovascularization: Secondary | ICD-10-CM | POA: Diagnosis not present

## 2017-01-04 DIAGNOSIS — H353120 Nonexudative age-related macular degeneration, left eye, stage unspecified: Secondary | ICD-10-CM | POA: Diagnosis not present

## 2017-02-07 ENCOUNTER — Encounter (INDEPENDENT_AMBULATORY_CARE_PROVIDER_SITE_OTHER): Payer: Medicare Other | Admitting: Ophthalmology

## 2017-02-07 DIAGNOSIS — H353211 Exudative age-related macular degeneration, right eye, with active choroidal neovascularization: Secondary | ICD-10-CM | POA: Diagnosis not present

## 2017-02-07 DIAGNOSIS — H35033 Hypertensive retinopathy, bilateral: Secondary | ICD-10-CM | POA: Diagnosis not present

## 2017-02-07 DIAGNOSIS — I10 Essential (primary) hypertension: Secondary | ICD-10-CM

## 2017-02-07 DIAGNOSIS — H353124 Nonexudative age-related macular degeneration, left eye, advanced atrophic with subfoveal involvement: Secondary | ICD-10-CM | POA: Diagnosis not present

## 2017-02-07 DIAGNOSIS — H43813 Vitreous degeneration, bilateral: Secondary | ICD-10-CM | POA: Diagnosis not present

## 2017-02-09 DIAGNOSIS — H02055 Trichiasis without entropian left lower eyelid: Secondary | ICD-10-CM | POA: Diagnosis not present

## 2017-02-14 DIAGNOSIS — K219 Gastro-esophageal reflux disease without esophagitis: Secondary | ICD-10-CM | POA: Diagnosis not present

## 2017-02-14 DIAGNOSIS — I10 Essential (primary) hypertension: Secondary | ICD-10-CM | POA: Diagnosis not present

## 2017-02-14 DIAGNOSIS — L659 Nonscarring hair loss, unspecified: Secondary | ICD-10-CM | POA: Diagnosis not present

## 2017-02-14 DIAGNOSIS — B0229 Other postherpetic nervous system involvement: Secondary | ICD-10-CM | POA: Diagnosis not present

## 2017-02-14 DIAGNOSIS — E039 Hypothyroidism, unspecified: Secondary | ICD-10-CM | POA: Diagnosis not present

## 2017-02-14 DIAGNOSIS — M85852 Other specified disorders of bone density and structure, left thigh: Secondary | ICD-10-CM | POA: Diagnosis not present

## 2017-02-14 DIAGNOSIS — E785 Hyperlipidemia, unspecified: Secondary | ICD-10-CM | POA: Diagnosis not present

## 2017-02-14 DIAGNOSIS — E559 Vitamin D deficiency, unspecified: Secondary | ICD-10-CM | POA: Diagnosis not present

## 2017-06-13 ENCOUNTER — Encounter (INDEPENDENT_AMBULATORY_CARE_PROVIDER_SITE_OTHER): Payer: Medicare Other | Admitting: Ophthalmology

## 2017-06-13 DIAGNOSIS — I10 Essential (primary) hypertension: Secondary | ICD-10-CM | POA: Diagnosis not present

## 2017-06-13 DIAGNOSIS — H353122 Nonexudative age-related macular degeneration, left eye, intermediate dry stage: Secondary | ICD-10-CM

## 2017-06-13 DIAGNOSIS — H43813 Vitreous degeneration, bilateral: Secondary | ICD-10-CM | POA: Diagnosis not present

## 2017-06-13 DIAGNOSIS — H353211 Exudative age-related macular degeneration, right eye, with active choroidal neovascularization: Secondary | ICD-10-CM

## 2017-06-13 DIAGNOSIS — H35033 Hypertensive retinopathy, bilateral: Secondary | ICD-10-CM

## 2017-07-06 DIAGNOSIS — H02055 Trichiasis without entropian left lower eyelid: Secondary | ICD-10-CM | POA: Diagnosis not present

## 2017-07-06 DIAGNOSIS — H02054 Trichiasis without entropian left upper eyelid: Secondary | ICD-10-CM | POA: Diagnosis not present

## 2017-07-16 DIAGNOSIS — Z1231 Encounter for screening mammogram for malignant neoplasm of breast: Secondary | ICD-10-CM | POA: Diagnosis not present

## 2017-07-30 DIAGNOSIS — Z23 Encounter for immunization: Secondary | ICD-10-CM | POA: Diagnosis not present

## 2017-08-21 DIAGNOSIS — E039 Hypothyroidism, unspecified: Secondary | ICD-10-CM | POA: Diagnosis not present

## 2017-08-21 DIAGNOSIS — Z1389 Encounter for screening for other disorder: Secondary | ICD-10-CM | POA: Diagnosis not present

## 2017-08-21 DIAGNOSIS — B0229 Other postherpetic nervous system involvement: Secondary | ICD-10-CM | POA: Diagnosis not present

## 2017-08-21 DIAGNOSIS — M85852 Other specified disorders of bone density and structure, left thigh: Secondary | ICD-10-CM | POA: Diagnosis not present

## 2017-08-21 DIAGNOSIS — I10 Essential (primary) hypertension: Secondary | ICD-10-CM | POA: Diagnosis not present

## 2017-08-21 DIAGNOSIS — E785 Hyperlipidemia, unspecified: Secondary | ICD-10-CM | POA: Diagnosis not present

## 2017-08-21 DIAGNOSIS — R42 Dizziness and giddiness: Secondary | ICD-10-CM | POA: Diagnosis not present

## 2017-08-21 DIAGNOSIS — Z0000 Encounter for general adult medical examination without abnormal findings: Secondary | ICD-10-CM | POA: Diagnosis not present

## 2017-08-21 DIAGNOSIS — K219 Gastro-esophageal reflux disease without esophagitis: Secondary | ICD-10-CM | POA: Diagnosis not present

## 2017-08-21 DIAGNOSIS — E559 Vitamin D deficiency, unspecified: Secondary | ICD-10-CM | POA: Diagnosis not present

## 2017-09-19 ENCOUNTER — Encounter (INDEPENDENT_AMBULATORY_CARE_PROVIDER_SITE_OTHER): Payer: Medicare Other | Admitting: Ophthalmology

## 2017-09-19 DIAGNOSIS — I10 Essential (primary) hypertension: Secondary | ICD-10-CM | POA: Diagnosis not present

## 2017-09-19 DIAGNOSIS — H353122 Nonexudative age-related macular degeneration, left eye, intermediate dry stage: Secondary | ICD-10-CM | POA: Diagnosis not present

## 2017-09-19 DIAGNOSIS — H353211 Exudative age-related macular degeneration, right eye, with active choroidal neovascularization: Secondary | ICD-10-CM

## 2017-09-19 DIAGNOSIS — H35033 Hypertensive retinopathy, bilateral: Secondary | ICD-10-CM | POA: Diagnosis not present

## 2017-09-19 DIAGNOSIS — H4313 Vitreous hemorrhage, bilateral: Secondary | ICD-10-CM | POA: Diagnosis not present

## 2017-10-07 DIAGNOSIS — M79641 Pain in right hand: Secondary | ICD-10-CM | POA: Diagnosis not present

## 2017-10-08 ENCOUNTER — Emergency Department (HOSPITAL_COMMUNITY)
Admission: EM | Admit: 2017-10-08 | Discharge: 2017-10-08 | Disposition: A | Discharge status: Home / Self Care | Payer: Medicare Other | Attending: Emergency Medicine | Admitting: Emergency Medicine

## 2017-10-08 ENCOUNTER — Encounter (HOSPITAL_COMMUNITY): Payer: Self-pay | Admitting: Emergency Medicine

## 2017-10-08 ENCOUNTER — Emergency Department (HOSPITAL_COMMUNITY): Payer: Medicare Other

## 2017-10-08 DIAGNOSIS — I10 Essential (primary) hypertension: Secondary | ICD-10-CM | POA: Diagnosis not present

## 2017-10-08 DIAGNOSIS — Z79899 Other long term (current) drug therapy: Secondary | ICD-10-CM | POA: Diagnosis not present

## 2017-10-08 DIAGNOSIS — M25431 Effusion, right wrist: Secondary | ICD-10-CM | POA: Insufficient documentation

## 2017-10-08 DIAGNOSIS — M7989 Other specified soft tissue disorders: Secondary | ICD-10-CM | POA: Diagnosis not present

## 2017-10-08 DIAGNOSIS — M25531 Pain in right wrist: Secondary | ICD-10-CM | POA: Diagnosis present

## 2017-10-08 NOTE — ED Provider Notes (Signed)
Medical screening examination/treatment/procedure(s) were conducted as a shared visit with non-physician practitioner(s) and myself.  I personally evaluated the patient during the encounter.  On exam, patient has soft tissue swelling in the antecubital region of her right wrist.  There is no pulsatile mass.  No signs of infection.  Normal movement.  We will check x-rays.  Likely will need outpatient orthopedics follow-up.   Dorie Rank, MD 10/08/17 1700

## 2017-10-08 NOTE — ED Notes (Signed)
Pt was informed of the wait time and was asked if they needed anything

## 2017-10-08 NOTE — ED Provider Notes (Signed)
[facility] DEPT Provider Note   CSN: [id number] Arrival date & time: 10/08/17  1137     History   Chief Complaint Chief Complaint  Patient presents with  . Hand Pain  . Arm Pain    HPI Karen Pearson is a 83 y.o. female presents to the emergency department chief complaint of right wrist and pain.  She developed onset of numbness in her distal fingertips about 1 month ago.  She has progressively worsening pain in the right hand and is unable to close the hand and grip she has associated pain in the wrist and occasional shooting pain up the right forearm.  What brought her to the emergency department today was that she woke up with a mass in her right wrist.  She denies any tenderness or previous history of such.  She denies any previous history of injury to the area.  She denies numbness or tingling outside of what is specified.  HPI  Past Medical History:  Diagnosis Date  . Cancer ([place])   . High cholesterol   . Hypertension   . Melanoma ([place])   . Thyroid disease     Patient Active Problem List   Diagnosis Date Noted  . Hypothyroidism 04/23/2016  . Subarachnoid bleed ([place]) 04/22/2016  . Syncope 04/22/2016    Past Surgical History:  Procedure Laterality Date  . ABDOMINAL HYSTERECTOMY    . EYE MUSCLE SURGERY      OB History    No data available       Home Medications    Prior to Admission medications   Medication Sig Start Date End Date Taking? Authorizing Provider  amLODipine (NORVASC) 5 MG tablet Take 5 mg by mouth daily. 03/31/16   [provider]  atorvastatin (LIPITOR) 10 MG tablet Take 10 mg by mouth daily. 03/28/16   [provider]  levothyroxine (SYNTHROID, LEVOTHROID) 75 MCG tablet Take 75 mcg by mouth daily before breakfast.    [provider]  Multiple Vitamins-Minerals (ICAPS AREDS 2 PO) Take 1 tablet by mouth daily.    [provider]  Vitamin D, Ergocalciferol, (DRISDOL) 50000 units  CAPS capsule Take 50,000 Units by mouth once a week. 02/16/16   [provider]    Family History No family history on file.  Social History Social History   Tobacco Use  . Smoking status: Never Smoker  . Smokeless tobacco: Never Used  Substance Use Topics  . Alcohol use: No  . Drug use: Not on file     Allergies   Patient has no known allergies.   Review of Systems Review of Systems Ten systems reviewed and are negative for acute change, except as noted in the HPI.    Physical Exam Updated Vital Signs BP 114/67   Pulse 84   Temp (!) 97.5 F (36.4 C) (Oral)   Resp 16   SpO2 98%   Physical Exam  Physical Exam  Nursing note and vitals reviewed. Constitutional: She is oriented to person, place, and time. She appears well-developed and well-nourished. No distress.  HENT:  Head: Normocephalic and atraumatic.  Eyes: Conjunctivae normal and EOM are normal. Pupils are equal, round, and reactive to light. No scleral icterus.  Neck: Normal range of motion.  Cardiovascular: Normal rate, regular rhythm and normal heart sounds.  Exam reveals no gallop and no friction rub.   No murmur heard. Pulmonary/Chest: Effort normal and breath sounds normal. No respiratory distress.  Abdominal: Soft. Bowel sounds are normal. She  exhibits no distension and no mass. There is no tenderness. There is no guarding.  Neurological: She is alert and oriented to person, place, and time.  Musculoskeletal: There is a soft tissue mass in the right wrist on the ulnar side.  Nontender, measuring approximately 4 cm.  Radial pulses palpable.  Good capillary refill.  Sensation is decreased at the tips of the fingers.  Unable to make a fist fingers or squeeze tightly. Skin: Skin is warm and dry. She is not diaphoretic.    ED Treatments / Results  Labs (all labs ordered are listed, but only abnormal results are displayed) Labs Reviewed - No data to display  EKG  EKG Interpretation None         Radiology No results found.  Procedures Procedures (including critical care time)  Medications Ordered in ED Medications - No data to display   Initial Impression / Assessment and Plan / ED Course  I have reviewed the triage vital signs and the nursing notes.  Pertinent labs & imaging results that were available during my care of the patient were reviewed by me and considered in my medical decision making (see chart for details).    ? Ganglion cyst Patient X-Ray negative for obvious fracture or dislocation. Pain managed in ED. Pt advised to follow up with orthopedics if symptoms persist for possibility of missed fracture diagnosis. Patient given brace while in ED, conservative therapy recommended and discussed. Patient will be dc home & is agreeable with above plan.   Final Clinical Impressions(s) / ED Diagnoses   Final diagnoses:  Wrist swelling, right    ED Discharge Orders    None       [place], [place], PA-C 10/08/17 1740    Dorie Rank, MD 10/12/17 [phone]

## 2017-10-08 NOTE — Discharge Instructions (Signed)
Follow these instructions at home: Do not press on the ganglion cyst, poke it with a needle, or hit it. Take medicines only as directed by your health care provider. Wear your brace or splint as directed by your health care provider. Watch your ganglion cyst for any changes. Keep all follow-up visits as directed by your health care provider. This is important. Contact a health care provider if: Your ganglion cyst becomes larger or more painful. You have increased redness, red streaks, or swelling. You have pus coming from the lump. You have weakness or numbness in the affected area. You have a fever or chills.

## 2017-10-08 NOTE — ED Triage Notes (Signed)
Patient reports couple months ago right fingertips where feeling numb and tingling. Now sensation has spread to right wrist with intermittent pains will shoot up right arm. Patient reports going to PCP within past month for this issue and was given gabapentin. patient reports that when she woke up next morning that she was confused and "tore me up so I wont take another one".

## 2017-10-09 DIAGNOSIS — G5600 Carpal tunnel syndrome, unspecified upper limb: Secondary | ICD-10-CM | POA: Diagnosis not present

## 2017-10-09 DIAGNOSIS — M79641 Pain in right hand: Secondary | ICD-10-CM | POA: Diagnosis not present

## 2017-10-09 DIAGNOSIS — M25531 Pain in right wrist: Secondary | ICD-10-CM | POA: Diagnosis not present

## 2017-10-09 DIAGNOSIS — R2231 Localized swelling, mass and lump, right upper limb: Secondary | ICD-10-CM | POA: Diagnosis not present

## 2017-10-22 DIAGNOSIS — R2231 Localized swelling, mass and lump, right upper limb: Secondary | ICD-10-CM | POA: Diagnosis not present

## 2017-10-22 DIAGNOSIS — M25531 Pain in right wrist: Secondary | ICD-10-CM | POA: Diagnosis not present

## 2017-11-08 DIAGNOSIS — G5611 Other lesions of median nerve, right upper limb: Secondary | ICD-10-CM | POA: Diagnosis not present

## 2017-11-08 DIAGNOSIS — M25831 Other specified joint disorders, right wrist: Secondary | ICD-10-CM | POA: Diagnosis not present

## 2017-11-08 DIAGNOSIS — M25531 Pain in right wrist: Secondary | ICD-10-CM | POA: Diagnosis not present

## 2017-12-21 DIAGNOSIS — M65831 Other synovitis and tenosynovitis, right forearm: Secondary | ICD-10-CM | POA: Diagnosis not present

## 2017-12-21 DIAGNOSIS — M13131 Monoarthritis, not elsewhere classified, right wrist: Secondary | ICD-10-CM | POA: Diagnosis not present

## 2017-12-21 DIAGNOSIS — G5601 Carpal tunnel syndrome, right upper limb: Secondary | ICD-10-CM | POA: Diagnosis not present

## 2017-12-21 DIAGNOSIS — M659 Synovitis and tenosynovitis, unspecified: Secondary | ICD-10-CM | POA: Diagnosis not present

## 2017-12-21 DIAGNOSIS — G8918 Other acute postprocedural pain: Secondary | ICD-10-CM | POA: Diagnosis not present

## 2017-12-21 DIAGNOSIS — M19031 Primary osteoarthritis, right wrist: Secondary | ICD-10-CM | POA: Diagnosis not present

## 2017-12-26 ENCOUNTER — Encounter (INDEPENDENT_AMBULATORY_CARE_PROVIDER_SITE_OTHER): Payer: Medicare Other | Admitting: Ophthalmology

## 2017-12-26 DIAGNOSIS — I10 Essential (primary) hypertension: Secondary | ICD-10-CM | POA: Diagnosis not present

## 2017-12-26 DIAGNOSIS — H353211 Exudative age-related macular degeneration, right eye, with active choroidal neovascularization: Secondary | ICD-10-CM

## 2017-12-26 DIAGNOSIS — H35033 Hypertensive retinopathy, bilateral: Secondary | ICD-10-CM | POA: Diagnosis not present

## 2017-12-26 DIAGNOSIS — H353122 Nonexudative age-related macular degeneration, left eye, intermediate dry stage: Secondary | ICD-10-CM

## 2017-12-26 DIAGNOSIS — H43813 Vitreous degeneration, bilateral: Secondary | ICD-10-CM | POA: Diagnosis not present

## 2018-01-01 DIAGNOSIS — M65831 Other synovitis and tenosynovitis, right forearm: Secondary | ICD-10-CM | POA: Diagnosis not present

## 2018-01-01 DIAGNOSIS — M79641 Pain in right hand: Secondary | ICD-10-CM | POA: Diagnosis not present

## 2018-01-01 DIAGNOSIS — G5601 Carpal tunnel syndrome, right upper limb: Secondary | ICD-10-CM | POA: Diagnosis not present

## 2018-01-01 DIAGNOSIS — M13831 Other specified arthritis, right wrist: Secondary | ICD-10-CM | POA: Diagnosis not present

## 2018-01-01 DIAGNOSIS — Z4889 Encounter for other specified surgical aftercare: Secondary | ICD-10-CM | POA: Diagnosis not present

## 2018-01-04 DIAGNOSIS — M25631 Stiffness of right wrist, not elsewhere classified: Secondary | ICD-10-CM | POA: Diagnosis not present

## 2018-01-08 DIAGNOSIS — H26492 Other secondary cataract, left eye: Secondary | ICD-10-CM | POA: Diagnosis not present

## 2018-01-08 DIAGNOSIS — H35033 Hypertensive retinopathy, bilateral: Secondary | ICD-10-CM | POA: Diagnosis not present

## 2018-01-08 DIAGNOSIS — H353212 Exudative age-related macular degeneration, right eye, with inactive choroidal neovascularization: Secondary | ICD-10-CM | POA: Diagnosis not present

## 2018-01-08 DIAGNOSIS — H353122 Nonexudative age-related macular degeneration, left eye, intermediate dry stage: Secondary | ICD-10-CM | POA: Diagnosis not present

## 2018-01-11 DIAGNOSIS — M25631 Stiffness of right wrist, not elsewhere classified: Secondary | ICD-10-CM | POA: Diagnosis not present

## 2018-01-18 DIAGNOSIS — M25631 Stiffness of right wrist, not elsewhere classified: Secondary | ICD-10-CM | POA: Diagnosis not present

## 2018-02-18 DIAGNOSIS — E785 Hyperlipidemia, unspecified: Secondary | ICD-10-CM | POA: Diagnosis not present

## 2018-02-18 DIAGNOSIS — E039 Hypothyroidism, unspecified: Secondary | ICD-10-CM | POA: Diagnosis not present

## 2018-02-18 DIAGNOSIS — R413 Other amnesia: Secondary | ICD-10-CM | POA: Diagnosis not present

## 2018-02-18 DIAGNOSIS — G479 Sleep disorder, unspecified: Secondary | ICD-10-CM | POA: Diagnosis not present

## 2018-02-18 DIAGNOSIS — M85852 Other specified disorders of bone density and structure, left thigh: Secondary | ICD-10-CM | POA: Diagnosis not present

## 2018-02-18 DIAGNOSIS — Z79899 Other long term (current) drug therapy: Secondary | ICD-10-CM | POA: Diagnosis not present

## 2018-02-18 DIAGNOSIS — E559 Vitamin D deficiency, unspecified: Secondary | ICD-10-CM | POA: Diagnosis not present

## 2018-02-18 DIAGNOSIS — I10 Essential (primary) hypertension: Secondary | ICD-10-CM | POA: Diagnosis not present

## 2018-03-26 DIAGNOSIS — R413 Other amnesia: Secondary | ICD-10-CM | POA: Diagnosis not present

## 2018-03-26 DIAGNOSIS — F321 Major depressive disorder, single episode, moderate: Secondary | ICD-10-CM | POA: Diagnosis not present

## 2018-03-26 DIAGNOSIS — G479 Sleep disorder, unspecified: Secondary | ICD-10-CM | POA: Diagnosis not present

## 2018-05-22 DIAGNOSIS — E039 Hypothyroidism, unspecified: Secondary | ICD-10-CM | POA: Diagnosis not present

## 2018-05-22 DIAGNOSIS — I10 Essential (primary) hypertension: Secondary | ICD-10-CM | POA: Diagnosis not present

## 2018-05-22 DIAGNOSIS — B0229 Other postherpetic nervous system involvement: Secondary | ICD-10-CM | POA: Diagnosis not present

## 2018-05-22 DIAGNOSIS — E559 Vitamin D deficiency, unspecified: Secondary | ICD-10-CM | POA: Diagnosis not present

## 2018-05-22 DIAGNOSIS — R413 Other amnesia: Secondary | ICD-10-CM | POA: Diagnosis not present

## 2018-05-22 DIAGNOSIS — F418 Other specified anxiety disorders: Secondary | ICD-10-CM | POA: Diagnosis not present

## 2018-05-22 DIAGNOSIS — M85852 Other specified disorders of bone density and structure, left thigh: Secondary | ICD-10-CM | POA: Diagnosis not present

## 2018-05-22 DIAGNOSIS — G479 Sleep disorder, unspecified: Secondary | ICD-10-CM | POA: Diagnosis not present

## 2018-05-22 DIAGNOSIS — E785 Hyperlipidemia, unspecified: Secondary | ICD-10-CM | POA: Diagnosis not present

## 2018-06-05 ENCOUNTER — Encounter (INDEPENDENT_AMBULATORY_CARE_PROVIDER_SITE_OTHER): Payer: Medicare Other | Admitting: Ophthalmology

## 2018-06-05 DIAGNOSIS — H353122 Nonexudative age-related macular degeneration, left eye, intermediate dry stage: Secondary | ICD-10-CM

## 2018-06-05 DIAGNOSIS — H353211 Exudative age-related macular degeneration, right eye, with active choroidal neovascularization: Secondary | ICD-10-CM | POA: Diagnosis not present

## 2018-06-05 DIAGNOSIS — H43813 Vitreous degeneration, bilateral: Secondary | ICD-10-CM

## 2018-06-05 DIAGNOSIS — I10 Essential (primary) hypertension: Secondary | ICD-10-CM | POA: Diagnosis not present

## 2018-06-05 DIAGNOSIS — H35033 Hypertensive retinopathy, bilateral: Secondary | ICD-10-CM

## 2018-07-11 DIAGNOSIS — H353212 Exudative age-related macular degeneration, right eye, with inactive choroidal neovascularization: Secondary | ICD-10-CM | POA: Diagnosis not present

## 2018-07-11 DIAGNOSIS — B0229 Other postherpetic nervous system involvement: Secondary | ICD-10-CM | POA: Diagnosis not present

## 2018-07-11 DIAGNOSIS — H353122 Nonexudative age-related macular degeneration, left eye, intermediate dry stage: Secondary | ICD-10-CM | POA: Diagnosis not present

## 2018-07-11 DIAGNOSIS — Z961 Presence of intraocular lens: Secondary | ICD-10-CM | POA: Diagnosis not present

## 2018-07-18 DIAGNOSIS — Z23 Encounter for immunization: Secondary | ICD-10-CM | POA: Diagnosis not present

## 2018-08-26 DIAGNOSIS — M85852 Other specified disorders of bone density and structure, left thigh: Secondary | ICD-10-CM | POA: Diagnosis not present

## 2018-08-26 DIAGNOSIS — Z1389 Encounter for screening for other disorder: Secondary | ICD-10-CM | POA: Diagnosis not present

## 2018-08-26 DIAGNOSIS — Z1239 Encounter for other screening for malignant neoplasm of breast: Secondary | ICD-10-CM | POA: Diagnosis not present

## 2018-08-26 DIAGNOSIS — R413 Other amnesia: Secondary | ICD-10-CM | POA: Diagnosis not present

## 2018-08-26 DIAGNOSIS — E785 Hyperlipidemia, unspecified: Secondary | ICD-10-CM | POA: Diagnosis not present

## 2018-08-26 DIAGNOSIS — B0229 Other postherpetic nervous system involvement: Secondary | ICD-10-CM | POA: Diagnosis not present

## 2018-08-26 DIAGNOSIS — G479 Sleep disorder, unspecified: Secondary | ICD-10-CM | POA: Diagnosis not present

## 2018-08-26 DIAGNOSIS — Z0000 Encounter for general adult medical examination without abnormal findings: Secondary | ICD-10-CM | POA: Diagnosis not present

## 2018-08-26 DIAGNOSIS — I10 Essential (primary) hypertension: Secondary | ICD-10-CM | POA: Diagnosis not present

## 2018-08-26 DIAGNOSIS — E039 Hypothyroidism, unspecified: Secondary | ICD-10-CM | POA: Diagnosis not present

## 2018-08-26 DIAGNOSIS — E559 Vitamin D deficiency, unspecified: Secondary | ICD-10-CM | POA: Diagnosis not present

## 2018-10-30 ENCOUNTER — Encounter (INDEPENDENT_AMBULATORY_CARE_PROVIDER_SITE_OTHER): Payer: Medicare Other | Admitting: Ophthalmology

## 2018-10-30 DIAGNOSIS — H35033 Hypertensive retinopathy, bilateral: Secondary | ICD-10-CM

## 2018-10-30 DIAGNOSIS — H43813 Vitreous degeneration, bilateral: Secondary | ICD-10-CM | POA: Diagnosis not present

## 2018-10-30 DIAGNOSIS — H353211 Exudative age-related macular degeneration, right eye, with active choroidal neovascularization: Secondary | ICD-10-CM

## 2018-10-30 DIAGNOSIS — H353122 Nonexudative age-related macular degeneration, left eye, intermediate dry stage: Secondary | ICD-10-CM | POA: Diagnosis not present

## 2018-10-30 DIAGNOSIS — I10 Essential (primary) hypertension: Secondary | ICD-10-CM

## 2019-01-10 DIAGNOSIS — C44622 Squamous cell carcinoma of skin of right upper limb, including shoulder: Secondary | ICD-10-CM | POA: Diagnosis not present

## 2019-01-10 DIAGNOSIS — X32XXXA Exposure to sunlight, initial encounter: Secondary | ICD-10-CM | POA: Diagnosis not present

## 2019-01-10 DIAGNOSIS — L570 Actinic keratosis: Secondary | ICD-10-CM | POA: Diagnosis not present

## 2019-01-10 DIAGNOSIS — C44319 Basal cell carcinoma of skin of other parts of face: Secondary | ICD-10-CM | POA: Diagnosis not present

## 2019-02-25 DIAGNOSIS — X32XXXD Exposure to sunlight, subsequent encounter: Secondary | ICD-10-CM | POA: Diagnosis not present

## 2019-02-25 DIAGNOSIS — C44311 Basal cell carcinoma of skin of nose: Secondary | ICD-10-CM | POA: Diagnosis not present

## 2019-02-25 DIAGNOSIS — L570 Actinic keratosis: Secondary | ICD-10-CM | POA: Diagnosis not present

## 2019-02-25 DIAGNOSIS — C4441 Basal cell carcinoma of skin of scalp and neck: Secondary | ICD-10-CM | POA: Diagnosis not present

## 2019-02-25 DIAGNOSIS — Z08 Encounter for follow-up examination after completed treatment for malignant neoplasm: Secondary | ICD-10-CM | POA: Diagnosis not present

## 2019-02-25 DIAGNOSIS — Z85828 Personal history of other malignant neoplasm of skin: Secondary | ICD-10-CM | POA: Diagnosis not present

## 2019-03-25 DIAGNOSIS — Z85828 Personal history of other malignant neoplasm of skin: Secondary | ICD-10-CM | POA: Diagnosis not present

## 2019-03-25 DIAGNOSIS — Z08 Encounter for follow-up examination after completed treatment for malignant neoplasm: Secondary | ICD-10-CM | POA: Diagnosis not present

## 2019-03-25 DIAGNOSIS — C44612 Basal cell carcinoma of skin of right upper limb, including shoulder: Secondary | ICD-10-CM | POA: Diagnosis not present

## 2019-04-01 DIAGNOSIS — H353122 Nonexudative age-related macular degeneration, left eye, intermediate dry stage: Secondary | ICD-10-CM | POA: Diagnosis not present

## 2019-04-01 DIAGNOSIS — Z961 Presence of intraocular lens: Secondary | ICD-10-CM | POA: Diagnosis not present

## 2019-04-01 DIAGNOSIS — G500 Trigeminal neuralgia: Secondary | ICD-10-CM | POA: Diagnosis not present

## 2019-04-01 DIAGNOSIS — H353212 Exudative age-related macular degeneration, right eye, with inactive choroidal neovascularization: Secondary | ICD-10-CM | POA: Diagnosis not present

## 2019-04-02 ENCOUNTER — Encounter (INDEPENDENT_AMBULATORY_CARE_PROVIDER_SITE_OTHER): Payer: Medicare Other | Admitting: Ophthalmology

## 2019-04-02 ENCOUNTER — Other Ambulatory Visit: Payer: Self-pay

## 2019-04-02 DIAGNOSIS — H43813 Vitreous degeneration, bilateral: Secondary | ICD-10-CM

## 2019-04-02 DIAGNOSIS — H35033 Hypertensive retinopathy, bilateral: Secondary | ICD-10-CM | POA: Diagnosis not present

## 2019-04-02 DIAGNOSIS — H353122 Nonexudative age-related macular degeneration, left eye, intermediate dry stage: Secondary | ICD-10-CM | POA: Diagnosis not present

## 2019-04-02 DIAGNOSIS — H353211 Exudative age-related macular degeneration, right eye, with active choroidal neovascularization: Secondary | ICD-10-CM

## 2019-04-02 DIAGNOSIS — I10 Essential (primary) hypertension: Secondary | ICD-10-CM

## 2019-04-22 DIAGNOSIS — L570 Actinic keratosis: Secondary | ICD-10-CM | POA: Diagnosis not present

## 2019-04-22 DIAGNOSIS — Z85828 Personal history of other malignant neoplasm of skin: Secondary | ICD-10-CM | POA: Diagnosis not present

## 2019-04-22 DIAGNOSIS — X32XXXD Exposure to sunlight, subsequent encounter: Secondary | ICD-10-CM | POA: Diagnosis not present

## 2019-04-22 DIAGNOSIS — Z08 Encounter for follow-up examination after completed treatment for malignant neoplasm: Secondary | ICD-10-CM | POA: Diagnosis not present

## 2019-04-22 DIAGNOSIS — C44311 Basal cell carcinoma of skin of nose: Secondary | ICD-10-CM | POA: Diagnosis not present

## 2019-06-03 DIAGNOSIS — Z85828 Personal history of other malignant neoplasm of skin: Secondary | ICD-10-CM | POA: Diagnosis not present

## 2019-06-03 DIAGNOSIS — C44319 Basal cell carcinoma of skin of other parts of face: Secondary | ICD-10-CM | POA: Diagnosis not present

## 2019-06-03 DIAGNOSIS — Z08 Encounter for follow-up examination after completed treatment for malignant neoplasm: Secondary | ICD-10-CM | POA: Diagnosis not present

## 2019-06-03 DIAGNOSIS — X32XXXD Exposure to sunlight, subsequent encounter: Secondary | ICD-10-CM | POA: Diagnosis not present

## 2019-06-03 DIAGNOSIS — L570 Actinic keratosis: Secondary | ICD-10-CM | POA: Diagnosis not present

## 2019-06-19 DIAGNOSIS — Z23 Encounter for immunization: Secondary | ICD-10-CM | POA: Diagnosis not present

## 2019-06-24 DIAGNOSIS — H02034 Senile entropion of left upper eyelid: Secondary | ICD-10-CM | POA: Diagnosis not present

## 2019-06-24 DIAGNOSIS — G500 Trigeminal neuralgia: Secondary | ICD-10-CM | POA: Diagnosis not present

## 2019-06-30 DIAGNOSIS — F321 Major depressive disorder, single episode, moderate: Secondary | ICD-10-CM | POA: Diagnosis not present

## 2019-06-30 DIAGNOSIS — I10 Essential (primary) hypertension: Secondary | ICD-10-CM | POA: Diagnosis not present

## 2019-06-30 DIAGNOSIS — E039 Hypothyroidism, unspecified: Secondary | ICD-10-CM | POA: Diagnosis not present

## 2019-06-30 DIAGNOSIS — E785 Hyperlipidemia, unspecified: Secondary | ICD-10-CM | POA: Diagnosis not present

## 2019-07-15 DIAGNOSIS — C44319 Basal cell carcinoma of skin of other parts of face: Secondary | ICD-10-CM | POA: Diagnosis not present

## 2019-07-15 DIAGNOSIS — Z85828 Personal history of other malignant neoplasm of skin: Secondary | ICD-10-CM | POA: Diagnosis not present

## 2019-07-15 DIAGNOSIS — Z08 Encounter for follow-up examination after completed treatment for malignant neoplasm: Secondary | ICD-10-CM | POA: Diagnosis not present

## 2019-07-15 DIAGNOSIS — S61412A Laceration without foreign body of left hand, initial encounter: Secondary | ICD-10-CM | POA: Diagnosis not present

## 2019-07-15 DIAGNOSIS — L570 Actinic keratosis: Secondary | ICD-10-CM | POA: Diagnosis not present

## 2019-07-15 DIAGNOSIS — X32XXXD Exposure to sunlight, subsequent encounter: Secondary | ICD-10-CM | POA: Diagnosis not present

## 2019-07-28 DIAGNOSIS — G500 Trigeminal neuralgia: Secondary | ICD-10-CM | POA: Diagnosis not present

## 2019-07-28 DIAGNOSIS — H02114 Cicatricial ectropion of left upper eyelid: Secondary | ICD-10-CM | POA: Diagnosis not present

## 2019-09-02 DIAGNOSIS — F321 Major depressive disorder, single episode, moderate: Secondary | ICD-10-CM | POA: Diagnosis not present

## 2019-09-02 DIAGNOSIS — E785 Hyperlipidemia, unspecified: Secondary | ICD-10-CM | POA: Diagnosis not present

## 2019-09-02 DIAGNOSIS — R413 Other amnesia: Secondary | ICD-10-CM | POA: Diagnosis not present

## 2019-09-02 DIAGNOSIS — E039 Hypothyroidism, unspecified: Secondary | ICD-10-CM | POA: Diagnosis not present

## 2019-09-02 DIAGNOSIS — K219 Gastro-esophageal reflux disease without esophagitis: Secondary | ICD-10-CM | POA: Diagnosis not present

## 2019-09-02 DIAGNOSIS — M85852 Other specified disorders of bone density and structure, left thigh: Secondary | ICD-10-CM | POA: Diagnosis not present

## 2019-09-02 DIAGNOSIS — E559 Vitamin D deficiency, unspecified: Secondary | ICD-10-CM | POA: Diagnosis not present

## 2019-09-02 DIAGNOSIS — G479 Sleep disorder, unspecified: Secondary | ICD-10-CM | POA: Diagnosis not present

## 2019-09-02 DIAGNOSIS — I10 Essential (primary) hypertension: Secondary | ICD-10-CM | POA: Diagnosis not present

## 2019-09-02 DIAGNOSIS — B0229 Other postherpetic nervous system involvement: Secondary | ICD-10-CM | POA: Diagnosis not present

## 2019-09-10 ENCOUNTER — Encounter (INDEPENDENT_AMBULATORY_CARE_PROVIDER_SITE_OTHER): Payer: Medicare Other | Admitting: Ophthalmology

## 2019-09-10 DIAGNOSIS — H35033 Hypertensive retinopathy, bilateral: Secondary | ICD-10-CM | POA: Diagnosis not present

## 2019-09-10 DIAGNOSIS — H43813 Vitreous degeneration, bilateral: Secondary | ICD-10-CM

## 2019-09-10 DIAGNOSIS — I10 Essential (primary) hypertension: Secondary | ICD-10-CM

## 2019-09-10 DIAGNOSIS — H353122 Nonexudative age-related macular degeneration, left eye, intermediate dry stage: Secondary | ICD-10-CM | POA: Diagnosis not present

## 2019-09-10 DIAGNOSIS — H353211 Exudative age-related macular degeneration, right eye, with active choroidal neovascularization: Secondary | ICD-10-CM

## 2019-12-18 ENCOUNTER — Ambulatory Visit: Payer: Medicare Other | Attending: Internal Medicine

## 2019-12-18 DIAGNOSIS — Z23 Encounter for immunization: Secondary | ICD-10-CM | POA: Insufficient documentation

## 2019-12-18 NOTE — Progress Notes (Signed)
   Covid-19 Vaccination Clinic  Name:  Karen Pearson    MRN: [id number] DOB: December 27, 1924  12/18/2019  [name] was observed post Covid-19 immunization for 15 minutes without incidence. She was provided with Vaccine Information Sheet and instruction to access the V-Safe system.   [name] was instructed to call 911 with any severe reactions post vaccine: [name] Kitchen Difficulty breathing  . Swelling of your face and throat  . A fast heartbeat  . A bad rash all over your body  . Dizziness and weakness    Immunizations Administered    Name Date Dose VIS Date Route   Pfizer COVID-19 Vaccine 12/18/2019  8:50 AM 0.3 mL 10/03/2019 Intramuscular   Manufacturer: [place]   Lot: [id number]   [place]: [id number]

## 2020-01-13 ENCOUNTER — Ambulatory Visit: Payer: Medicare Other | Attending: Internal Medicine

## 2020-01-13 DIAGNOSIS — Z23 Encounter for immunization: Secondary | ICD-10-CM

## 2020-01-13 NOTE — Progress Notes (Signed)
   Covid-19 Vaccination Clinic  Name:  Karen Pearson    MRN: [id number] DOB: Dec 06, 1924  01/13/2020  Karen Pearson was observed post Covid-19 immunization for 15 minutes without incident. She was provided with Vaccine Information Sheet and instruction to access the V-Safe system.   Karen Pearson was instructed to call 911 with any severe reactions post vaccine: [name] Kitchen Difficulty breathing  . Swelling of face and throat  . A fast heartbeat  . A bad rash all over body  . Dizziness and weakness   Immunizations Administered    Name Date Dose VIS Date Route   Pfizer COVID-19 Vaccine 01/13/2020  8:37 AM 0.3 mL 10/03/2019 Intramuscular   Manufacturer: [place]   Lot: [id number]   [place]: [id number]

## 2020-02-04 ENCOUNTER — Encounter (INDEPENDENT_AMBULATORY_CARE_PROVIDER_SITE_OTHER): Payer: Medicare Other | Admitting: Ophthalmology

## 2020-02-04 DIAGNOSIS — I10 Essential (primary) hypertension: Secondary | ICD-10-CM | POA: Diagnosis not present

## 2020-02-04 DIAGNOSIS — H353211 Exudative age-related macular degeneration, right eye, with active choroidal neovascularization: Secondary | ICD-10-CM

## 2020-02-04 DIAGNOSIS — H35033 Hypertensive retinopathy, bilateral: Secondary | ICD-10-CM | POA: Diagnosis not present

## 2020-02-04 DIAGNOSIS — H43813 Vitreous degeneration, bilateral: Secondary | ICD-10-CM

## 2020-02-04 DIAGNOSIS — H353122 Nonexudative age-related macular degeneration, left eye, intermediate dry stage: Secondary | ICD-10-CM

## 2020-05-21 DIAGNOSIS — E785 Hyperlipidemia, unspecified: Secondary | ICD-10-CM | POA: Diagnosis not present

## 2020-05-21 DIAGNOSIS — F321 Major depressive disorder, single episode, moderate: Secondary | ICD-10-CM | POA: Diagnosis not present

## 2020-05-21 DIAGNOSIS — I10 Essential (primary) hypertension: Secondary | ICD-10-CM | POA: Diagnosis not present

## 2020-05-21 DIAGNOSIS — E039 Hypothyroidism, unspecified: Secondary | ICD-10-CM | POA: Diagnosis not present

## 2020-06-15 DIAGNOSIS — R1111 Vomiting without nausea: Secondary | ICD-10-CM | POA: Diagnosis not present

## 2020-06-15 DIAGNOSIS — R252 Cramp and spasm: Secondary | ICD-10-CM | POA: Diagnosis not present

## 2020-07-07 ENCOUNTER — Encounter (INDEPENDENT_AMBULATORY_CARE_PROVIDER_SITE_OTHER): Payer: Medicare Other | Admitting: Ophthalmology

## 2020-07-19 DIAGNOSIS — Z23 Encounter for immunization: Secondary | ICD-10-CM | POA: Diagnosis not present

## 2020-08-17 DIAGNOSIS — Z23 Encounter for immunization: Secondary | ICD-10-CM | POA: Diagnosis not present

## 2020-10-28 DIAGNOSIS — M85852 Other specified disorders of bone density and structure, left thigh: Secondary | ICD-10-CM | POA: Diagnosis not present

## 2020-10-28 DIAGNOSIS — I10 Essential (primary) hypertension: Secondary | ICD-10-CM | POA: Diagnosis not present

## 2020-10-28 DIAGNOSIS — E039 Hypothyroidism, unspecified: Secondary | ICD-10-CM | POA: Diagnosis not present

## 2020-10-28 DIAGNOSIS — F0390 Unspecified dementia without behavioral disturbance: Secondary | ICD-10-CM | POA: Diagnosis not present

## 2020-10-28 DIAGNOSIS — G479 Sleep disorder, unspecified: Secondary | ICD-10-CM | POA: Diagnosis not present

## 2020-10-28 DIAGNOSIS — E785 Hyperlipidemia, unspecified: Secondary | ICD-10-CM | POA: Diagnosis not present

## 2020-10-28 DIAGNOSIS — R112 Nausea with vomiting, unspecified: Secondary | ICD-10-CM | POA: Diagnosis not present

## 2021-04-26 DIAGNOSIS — I10 Essential (primary) hypertension: Secondary | ICD-10-CM | POA: Diagnosis not present

## 2021-04-26 DIAGNOSIS — M85852 Other specified disorders of bone density and structure, left thigh: Secondary | ICD-10-CM | POA: Diagnosis not present

## 2021-04-26 DIAGNOSIS — E039 Hypothyroidism, unspecified: Secondary | ICD-10-CM | POA: Diagnosis not present

## 2021-04-26 DIAGNOSIS — R112 Nausea with vomiting, unspecified: Secondary | ICD-10-CM | POA: Diagnosis not present

## 2021-04-26 DIAGNOSIS — F0390 Unspecified dementia without behavioral disturbance: Secondary | ICD-10-CM | POA: Diagnosis not present

## 2021-04-26 DIAGNOSIS — G479 Sleep disorder, unspecified: Secondary | ICD-10-CM | POA: Diagnosis not present

## 2021-04-26 DIAGNOSIS — E785 Hyperlipidemia, unspecified: Secondary | ICD-10-CM | POA: Diagnosis not present

## 2021-04-29 DIAGNOSIS — G479 Sleep disorder, unspecified: Secondary | ICD-10-CM | POA: Diagnosis not present

## 2021-04-29 DIAGNOSIS — E559 Vitamin D deficiency, unspecified: Secondary | ICD-10-CM | POA: Diagnosis not present

## 2021-04-29 DIAGNOSIS — E785 Hyperlipidemia, unspecified: Secondary | ICD-10-CM | POA: Diagnosis not present

## 2021-04-29 DIAGNOSIS — I10 Essential (primary) hypertension: Secondary | ICD-10-CM | POA: Diagnosis not present

## 2021-04-29 DIAGNOSIS — E039 Hypothyroidism, unspecified: Secondary | ICD-10-CM | POA: Diagnosis not present

## 2021-04-29 DIAGNOSIS — R053 Chronic cough: Secondary | ICD-10-CM | POA: Diagnosis not present

## 2021-04-29 DIAGNOSIS — F0390 Unspecified dementia without behavioral disturbance: Secondary | ICD-10-CM | POA: Diagnosis not present

## 2021-05-12 DIAGNOSIS — Z23 Encounter for immunization: Secondary | ICD-10-CM | POA: Diagnosis not present

## 2021-07-27 DIAGNOSIS — E039 Hypothyroidism, unspecified: Secondary | ICD-10-CM | POA: Diagnosis not present

## 2021-07-27 DIAGNOSIS — E559 Vitamin D deficiency, unspecified: Secondary | ICD-10-CM | POA: Diagnosis not present

## 2021-08-02 DIAGNOSIS — Z23 Encounter for immunization: Secondary | ICD-10-CM | POA: Diagnosis not present

## 2021-10-28 DIAGNOSIS — E039 Hypothyroidism, unspecified: Secondary | ICD-10-CM | POA: Diagnosis not present

## 2021-10-28 DIAGNOSIS — E785 Hyperlipidemia, unspecified: Secondary | ICD-10-CM | POA: Diagnosis not present

## 2021-10-28 DIAGNOSIS — I10 Essential (primary) hypertension: Secondary | ICD-10-CM | POA: Diagnosis not present

## 2021-10-28 DIAGNOSIS — E559 Vitamin D deficiency, unspecified: Secondary | ICD-10-CM | POA: Diagnosis not present

## 2021-11-01 DIAGNOSIS — E785 Hyperlipidemia, unspecified: Secondary | ICD-10-CM | POA: Diagnosis not present

## 2021-11-01 DIAGNOSIS — F0390 Unspecified dementia without behavioral disturbance: Secondary | ICD-10-CM | POA: Diagnosis not present

## 2021-11-01 DIAGNOSIS — R1013 Epigastric pain: Secondary | ICD-10-CM | POA: Diagnosis not present

## 2021-11-01 DIAGNOSIS — J340 Abscess, furuncle and carbuncle of nose: Secondary | ICD-10-CM | POA: Diagnosis not present

## 2021-11-01 DIAGNOSIS — E039 Hypothyroidism, unspecified: Secondary | ICD-10-CM | POA: Diagnosis not present

## 2021-11-22 DIAGNOSIS — L820 Inflamed seborrheic keratosis: Secondary | ICD-10-CM | POA: Diagnosis not present

## 2021-11-22 DIAGNOSIS — C44311 Basal cell carcinoma of skin of nose: Secondary | ICD-10-CM | POA: Diagnosis not present

## 2021-11-22 DIAGNOSIS — L281 Prurigo nodularis: Secondary | ICD-10-CM | POA: Diagnosis not present

## 2022-01-09 ENCOUNTER — Emergency Department (HOSPITAL_COMMUNITY): Payer: Medicare Other

## 2022-01-09 ENCOUNTER — Encounter (HOSPITAL_COMMUNITY): Payer: Self-pay

## 2022-01-09 ENCOUNTER — Inpatient Hospital Stay (HOSPITAL_COMMUNITY)
Admission: EM | Admit: 2022-01-09 | Discharge: 2022-01-12 | DRG: 522 | Disposition: A | Discharge status: Skilled Nursing Facility | Payer: Medicare Other | Attending: Internal Medicine | Admitting: Internal Medicine

## 2022-01-09 ENCOUNTER — Other Ambulatory Visit: Payer: Self-pay

## 2022-01-09 DIAGNOSIS — R5381 Other malaise: Secondary | ICD-10-CM | POA: Diagnosis not present

## 2022-01-09 DIAGNOSIS — S72001A Fracture of unspecified part of neck of right femur, initial encounter for closed fracture: Secondary | ICD-10-CM | POA: Diagnosis not present

## 2022-01-09 DIAGNOSIS — Z96641 Presence of right artificial hip joint: Secondary | ICD-10-CM | POA: Diagnosis not present

## 2022-01-09 DIAGNOSIS — E782 Mixed hyperlipidemia: Secondary | ICD-10-CM | POA: Diagnosis not present

## 2022-01-09 DIAGNOSIS — E7800 Pure hypercholesterolemia, unspecified: Secondary | ICD-10-CM | POA: Diagnosis present

## 2022-01-09 DIAGNOSIS — W010XXA Fall on same level from slipping, tripping and stumbling without subsequent striking against object, initial encounter: Secondary | ICD-10-CM | POA: Diagnosis present

## 2022-01-09 DIAGNOSIS — R41841 Cognitive communication deficit: Secondary | ICD-10-CM | POA: Diagnosis not present

## 2022-01-09 DIAGNOSIS — D649 Anemia, unspecified: Secondary | ICD-10-CM

## 2022-01-09 DIAGNOSIS — G319 Degenerative disease of nervous system, unspecified: Secondary | ICD-10-CM | POA: Diagnosis not present

## 2022-01-09 DIAGNOSIS — I10 Essential (primary) hypertension: Secondary | ICD-10-CM | POA: Diagnosis not present

## 2022-01-09 DIAGNOSIS — Z90710 Acquired absence of both cervix and uterus: Secondary | ICD-10-CM | POA: Diagnosis not present

## 2022-01-09 DIAGNOSIS — S72011A Unspecified intracapsular fracture of right femur, initial encounter for closed fracture: Secondary | ICD-10-CM | POA: Diagnosis not present

## 2022-01-09 DIAGNOSIS — Z79899 Other long term (current) drug therapy: Secondary | ICD-10-CM

## 2022-01-09 DIAGNOSIS — F03918 Unspecified dementia, unspecified severity, with other behavioral disturbance: Secondary | ICD-10-CM | POA: Diagnosis present

## 2022-01-09 DIAGNOSIS — E039 Hypothyroidism, unspecified: Secondary | ICD-10-CM | POA: Diagnosis present

## 2022-01-09 DIAGNOSIS — Y92009 Unspecified place in unspecified non-institutional (private) residence as the place of occurrence of the external cause: Secondary | ICD-10-CM

## 2022-01-09 DIAGNOSIS — H548 Legal blindness, as defined in USA: Secondary | ICD-10-CM | POA: Diagnosis not present

## 2022-01-09 DIAGNOSIS — D638 Anemia in other chronic diseases classified elsewhere: Secondary | ICD-10-CM | POA: Diagnosis not present

## 2022-01-09 DIAGNOSIS — D6489 Other specified anemias: Secondary | ICD-10-CM | POA: Diagnosis present

## 2022-01-09 DIAGNOSIS — I5032 Chronic diastolic (congestive) heart failure: Secondary | ICD-10-CM | POA: Diagnosis present

## 2022-01-09 DIAGNOSIS — M25551 Pain in right hip: Secondary | ICD-10-CM | POA: Diagnosis not present

## 2022-01-09 DIAGNOSIS — W19XXXA Unspecified fall, initial encounter: Secondary | ICD-10-CM | POA: Diagnosis not present

## 2022-01-09 DIAGNOSIS — F02B18 Dementia in other diseases classified elsewhere, moderate, with other behavioral disturbance: Secondary | ICD-10-CM | POA: Diagnosis present

## 2022-01-09 DIAGNOSIS — M1990 Unspecified osteoarthritis, unspecified site: Secondary | ICD-10-CM | POA: Diagnosis not present

## 2022-01-09 DIAGNOSIS — R739 Hyperglycemia, unspecified: Secondary | ICD-10-CM | POA: Diagnosis present

## 2022-01-09 DIAGNOSIS — G309 Alzheimer's disease, unspecified: Secondary | ICD-10-CM | POA: Diagnosis not present

## 2022-01-09 DIAGNOSIS — M7989 Other specified soft tissue disorders: Secondary | ICD-10-CM | POA: Diagnosis not present

## 2022-01-09 DIAGNOSIS — Z043 Encounter for examination and observation following other accident: Secondary | ICD-10-CM | POA: Diagnosis not present

## 2022-01-09 DIAGNOSIS — R0902 Hypoxemia: Secondary | ICD-10-CM | POA: Diagnosis not present

## 2022-01-09 DIAGNOSIS — I130 Hypertensive heart and chronic kidney disease with heart failure and stage 1 through stage 4 chronic kidney disease, or unspecified chronic kidney disease: Secondary | ICD-10-CM | POA: Diagnosis not present

## 2022-01-09 DIAGNOSIS — R110 Nausea: Secondary | ICD-10-CM | POA: Diagnosis not present

## 2022-01-09 DIAGNOSIS — Z66 Do not resuscitate: Secondary | ICD-10-CM | POA: Diagnosis not present

## 2022-01-09 DIAGNOSIS — M25461 Effusion, right knee: Secondary | ICD-10-CM | POA: Diagnosis not present

## 2022-01-09 DIAGNOSIS — N1831 Chronic kidney disease, stage 3a: Secondary | ICD-10-CM | POA: Diagnosis present

## 2022-01-09 DIAGNOSIS — Z85820 Personal history of malignant melanoma of skin: Secondary | ICD-10-CM | POA: Diagnosis not present

## 2022-01-09 DIAGNOSIS — R296 Repeated falls: Secondary | ICD-10-CM | POA: Diagnosis present

## 2022-01-09 DIAGNOSIS — Z20822 Contact with and (suspected) exposure to covid-19: Secondary | ICD-10-CM | POA: Diagnosis not present

## 2022-01-09 DIAGNOSIS — E785 Hyperlipidemia, unspecified: Secondary | ICD-10-CM | POA: Diagnosis present

## 2022-01-09 DIAGNOSIS — R1314 Dysphagia, pharyngoesophageal phase: Secondary | ICD-10-CM | POA: Diagnosis not present

## 2022-01-09 DIAGNOSIS — G8918 Other acute postprocedural pain: Secondary | ICD-10-CM | POA: Diagnosis not present

## 2022-01-09 DIAGNOSIS — S72001S Fracture of unspecified part of neck of right femur, sequela: Secondary | ICD-10-CM | POA: Diagnosis not present

## 2022-01-09 DIAGNOSIS — Z471 Aftercare following joint replacement surgery: Secondary | ICD-10-CM | POA: Diagnosis not present

## 2022-01-09 DIAGNOSIS — M6281 Muscle weakness (generalized): Secondary | ICD-10-CM | POA: Diagnosis not present

## 2022-01-09 DIAGNOSIS — R262 Difficulty in walking, not elsewhere classified: Secondary | ICD-10-CM | POA: Diagnosis not present

## 2022-01-09 DIAGNOSIS — Z7401 Bed confinement status: Secondary | ICD-10-CM | POA: Diagnosis not present

## 2022-01-09 DIAGNOSIS — S72041A Displaced fracture of base of neck of right femur, initial encounter for closed fracture: Secondary | ICD-10-CM | POA: Diagnosis not present

## 2022-01-09 DIAGNOSIS — R4182 Altered mental status, unspecified: Secondary | ICD-10-CM | POA: Diagnosis not present

## 2022-01-09 LAB — BASIC METABOLIC PANEL
Anion gap: 9 (ref 5–15)
BUN: 20 mg/dL (ref 8–23)
CO2: 25 mmol/L (ref 22–32)
Calcium: 8.8 mg/dL — ABNORMAL LOW (ref 8.9–10.3)
Chloride: 106 mmol/L (ref 98–111)
Creatinine, Ser: 1.07 mg/dL — ABNORMAL HIGH (ref 0.44–1.00)
GFR, Estimated: 48 mL/min — ABNORMAL LOW (ref >60)
Glucose, Bld: 146 mg/dL — ABNORMAL HIGH (ref 70–99)
Potassium: 4 mmol/L (ref 3.5–5.1)
Sodium: 140 mmol/L (ref 135–145)

## 2022-01-09 LAB — URINALYSIS, ROUTINE W REFLEX MICROSCOPIC
Bacteria, UA: NONE SEEN
Bilirubin Urine: NEGATIVE
Glucose, UA: NEGATIVE mg/dL
Ketones, ur: 20 mg/dL — AB
Leukocytes,Ua: NEGATIVE
Nitrite: NEGATIVE
Protein, ur: NEGATIVE mg/dL
Specific Gravity, Urine: 1.012 (ref 1.005–1.030)
pH: 7 (ref 5.0–8.0)

## 2022-01-09 LAB — HEPATIC FUNCTION PANEL
ALT: 13 U/L (ref 0–44)
AST: 22 U/L (ref 15–41)
Albumin: 4 g/dL (ref 3.5–5.0)
Alkaline Phosphatase: 73 U/L (ref 38–126)
Bilirubin, Direct: <0.1 mg/dL (ref 0.0–0.2)
Total Bilirubin: 0.9 mg/dL (ref 0.3–1.2)
Total Protein: 7.3 g/dL (ref 6.5–8.1)

## 2022-01-09 LAB — TROPONIN I (HIGH SENSITIVITY): Troponin I (High Sensitivity): 12 ng/L (ref <18)

## 2022-01-09 LAB — PROTIME-INR
INR: 1.1 (ref 0.8–1.2)
Prothrombin Time: 14 seconds (ref 11.4–15.2)

## 2022-01-09 LAB — CBC WITH DIFFERENTIAL/PLATELET
Abs Immature Granulocytes: 0.05 10*3/uL (ref 0.00–0.07)
Basophils Absolute: 0.1 10*3/uL (ref 0.0–0.1)
Basophils Relative: 1 %
Eosinophils Absolute: 0 10*3/uL (ref 0.0–0.5)
Eosinophils Relative: 0 %
HCT: 36.1 % (ref 36.0–46.0)
Hemoglobin: 12 g/dL (ref 12.0–15.0)
Immature Granulocytes: 1 %
Lymphocytes Relative: 6 %
Lymphs Abs: 0.6 10*3/uL — ABNORMAL LOW (ref 0.7–4.0)
MCH: 31 pg (ref 26.0–34.0)
MCHC: 33.2 g/dL (ref 30.0–36.0)
MCV: 93.3 fL (ref 80.0–100.0)
Monocytes Absolute: 0.4 10*3/uL (ref 0.1–1.0)
Monocytes Relative: 4 %
Neutro Abs: 9 10*3/uL — ABNORMAL HIGH (ref 1.7–7.7)
Neutrophils Relative %: 88 %
Platelets: 247 10*3/uL (ref 150–400)
RBC: 3.87 MIL/uL (ref 3.87–5.11)
RDW: 12.8 % (ref 11.5–15.5)
WBC: 10.1 10*3/uL (ref 4.0–10.5)
nRBC: 0 % (ref 0.0–0.2)

## 2022-01-09 LAB — CK: Total CK: 115 U/L (ref 38–234)

## 2022-01-09 LAB — RESP PANEL BY RT-PCR (FLU A&B, COVID) ARPGX2
Influenza A by PCR: NEGATIVE
Influenza B by PCR: NEGATIVE
SARS Coronavirus 2 by RT PCR: NEGATIVE

## 2022-01-09 LAB — MAGNESIUM: Magnesium: 2.1 mg/dL (ref 1.7–2.4)

## 2022-01-09 LAB — PHOSPHORUS: Phosphorus: 2.8 mg/dL (ref 2.5–4.6)

## 2022-01-09 LAB — SODIUM, URINE, RANDOM: Sodium, Ur: 202 mmol/L

## 2022-01-09 LAB — CREATININE, URINE, RANDOM: Creatinine, Urine: 59.6 mg/dL

## 2022-01-09 MED ORDER — MORPHINE SULFATE (PF) 2 MG/ML IV SOLN: 0.5000 mg | INTRAVENOUS | Status: DC | PRN

## 2022-01-09 MED ORDER — GABAPENTIN 300 MG PO CAPS: 300.0000 mg | ORAL_CAPSULE | Freq: Every evening | ORAL | Status: DC | PRN

## 2022-01-09 MED ORDER — LEVOTHYROXINE SODIUM 50 MCG PO TABS: 75.0000 ug | ORAL_TABLET | Freq: Every day | ORAL | Status: DC

## 2022-01-09 MED ORDER — POVIDONE-IODINE 10 % EX SWAB
2.0000 "application " | Freq: Once | CUTANEOUS | Status: AC
  Administered 2022-01-10: 2 via TOPICAL

## 2022-01-09 MED ORDER — POVIDONE-IODINE 10 % EX SWAB
2.0000 "application " | Freq: Once | CUTANEOUS | Status: DC
  Filled 2022-01-09: qty 2

## 2022-01-09 MED ORDER — METHOCARBAMOL 1000 MG/10ML IJ SOLN
500.0000 mg | Freq: Four times a day (QID) | INTRAVENOUS | Status: DC | PRN
  Filled 2022-01-09: qty 5

## 2022-01-09 MED ORDER — CHLORHEXIDINE GLUCONATE 4 % EX LIQD
60.0000 mL | Freq: Once | CUTANEOUS | Status: AC
  Administered 2022-01-10: 4 via TOPICAL
  Filled 2022-01-09: qty 60

## 2022-01-09 MED ORDER — METHOCARBAMOL 500 MG PO TABS: 500.0000 mg | ORAL_TABLET | Freq: Four times a day (QID) | ORAL | Status: DC | PRN

## 2022-01-09 MED ORDER — ATORVASTATIN CALCIUM 10 MG PO TABS: 10.0000 mg | ORAL_TABLET | Freq: Every day | ORAL | Status: DC

## 2022-01-09 MED ORDER — TRANEXAMIC ACID-NACL 1000-0.7 MG/100ML-% IV SOLN
1000.0000 mg | INTRAVENOUS | Status: AC
  Administered 2022-01-10: 1000 mg via INTRAVENOUS
  Filled 2022-01-09: qty 100

## 2022-01-09 MED ORDER — HYDROCODONE-ACETAMINOPHEN 5-325 MG PO TABS
1.0000 | ORAL_TABLET | Freq: Four times a day (QID) | ORAL | Status: DC | PRN
  Administered 2022-01-10: 1 via ORAL
  Filled 2022-01-09: qty 1

## 2022-01-09 MED ORDER — AMLODIPINE BESYLATE 5 MG PO TABS: 5.0000 mg | ORAL_TABLET | Freq: Every day | ORAL | Status: DC

## 2022-01-09 MED ORDER — CEFAZOLIN SODIUM-DEXTROSE 2-4 GM/100ML-% IV SOLN
2.0000 g | INTRAVENOUS | Status: AC
  Administered 2022-01-10: 2 g via INTRAVENOUS
  Filled 2022-01-09: qty 100

## 2022-01-09 NOTE — ED Triage Notes (Signed)
Per EMS, Pt, from home, had an unwitnessed fall this evening.  Pt c/o R hip pain.  Shortening and rotation noted.  No signs that Pt hit head and Pt denies.  No blood thinners. ? ?Hx of dementia.  ?

## 2022-01-09 NOTE — ED Notes (Signed)
Patient transported to CT and XR 

## 2022-01-09 NOTE — Assessment & Plan Note (Signed)
Continue lipitor  ?

## 2022-01-09 NOTE — Assessment & Plan Note (Signed)
Stable chronic not on any diuretics will avoid fluid overload ?

## 2022-01-09 NOTE — Assessment & Plan Note (Signed)
Monitor for any sign of sundowning 

## 2022-01-09 NOTE — Assessment & Plan Note (Signed)
-

## 2022-01-09 NOTE — ED Notes (Signed)
Pt back from XR 

## 2022-01-09 NOTE — ED Notes (Signed)
Upon entering patients room, both IVs were on the floor. Mittens were placed for patient safety.  ?

## 2022-01-09 NOTE — H&P (Addendum)
? ? ?Karen Pearson HYW:[id number] DOB: 1924-12-08 DOA: 01/09/2022 ?  ?  ?PCP: Cari Caraway, MD   ?Outpatient Specialists:  ?Dermatology  ? ?Patient arrived to ER on 01/09/22 at 1805 ?Referred by Attending Long, Wonda Olds, MD ? ? ?Patient coming from:   ? home Lives  With family ?  ? ?Chief Complaint:   ?Chief Complaint  ?Patient presents with  ? Fall  ? Hip Injury  ? ? ?HPI: ?Karen Pearson is a 88 y.o. female with medical history significant of hypothyroidism, dementia , HLD,  ? HOH, legally blind, HTN, basal cell cancer ? ?Presented with  a fall ?Pt had a fall today with right groin pain unable to bear weight ?Unwitnessed by family were right there ? Not on blood thinners ?Walks with assist ?Husband unsure how she fell, he leads her around the house ?She fell off the bed last weekend ?Listed as subarachnoid bleed hx but family denies ?Now at times cannot recollect who is her family ?Able to walk about a block no report or CP or SOB  ? ? Initial COVID TEST  in house  PCR testing  Pending ? ?No results found for: SARSCOV2NAA ?  ?Regarding pertinent Chronic problems:   ? ? Hyperlipidemia -  on statins Lipitor ?Lipid Panel  ?No results found for: CHOL, TRIG, HDL, CHOLHDL, VLDL, LDLCALC, LDLDIRECT, LABVLDL ? ? HTN on norvasc ? ? chronic CHF diastolic/ - last echo 4854 grade 1 diastolic  ?  dysfunction ?  ? ? Hypothyroidism:  on synthroid ?  ?  Dementia - on neurontin ?   ?While in ER: ?  ?Plain film showed right hip fracture ?  ?Ordered ? ?CT HEAD   NON acute ? ?CXR -  NON acute ?   ?Following Medications were ordered in ER: ?Medications - No data to display  ?_______________________________________________________ ?ER Provider Called:  orthopedics   Dr. Delfino Lovett ?They Recommend admit to medicine   ?Will see in AM    ?  ?ED Triage Vitals  ?Enc Vitals Group  ?   BP 01/09/22 1815 (!) 170/83  ?   Pulse Rate 01/09/22 1815 84  ?   Resp 01/09/22 1815 20  ?   Temp 01/09/22 1815 98.1 ?F (36.7 ?C)  ?   Temp Source 01/09/22 1815  Oral  ?   SpO2 01/09/22 1815 95 %  ?   Weight 01/09/22 1819 125 lb (56.7 kg)  ?   Height --   ?   Head Circumference --   ?   Peak Flow --   ?   Pain Score --   ?   Pain Loc --   ?   Pain Edu? --   ?   Excl. in [place]? --   ?OEVO(35)@    ? _________________________________________ ?Significant initial  Findings: ?Abnormal Labs Reviewed  ?BASIC METABOLIC PANEL - Abnormal; Notable for the following components:  ?    Result Value  ? Glucose, Bld 146 (*)   ? Creatinine, Ser 1.07 (*)   ? Calcium 8.8 (*)   ? GFR, Estimated 48 (*)   ? All other components within normal limits  ?CBC WITH DIFFERENTIAL/PLATELET - Abnormal; Notable for the following components:  ? Neutro Abs 9.0 (*)   ? Lymphs Abs 0.6 (*)   ? All other components within normal limits  ? ?  ?_________________________ ?Troponin  ordered ?ECG: Ordered ?Personally reviewed by me showing: ?HR : 85 ?Rhythm:Sinus rhythm ?Short PR interval ?Consider right atrial enlargement ?Abnormal  R-wave progression, early transition ?Minimal ST depression, anterolateral leads ?QTC 476 ?  ? ?The recent clinical data is shown below. ?Vitals:  ? 01/09/22 1815 01/09/22 1819 01/09/22 1945  ?BP: (!) 170/83  (!) 143/113  ?Pulse: 84  95  ?Resp: 20  20  ?Temp: 98.1 ?F (36.7 ?C)    ?TempSrc: Oral    ?SpO2: 95%  97%  ?Weight:  56.7 kg   ? ?  ?WBC ? ?   ?Component Value Date/Time  ? WBC 10.1 01/09/2022 1836  ? LYMPHSABS 0.6 (L) 01/09/2022 1836  ? MONOABS 0.4 01/09/2022 1836  ? EOSABS 0.0 01/09/2022 1836  ? BASOSABS 0.1 01/09/2022 1836  ?  ? UA  ordered ?   ?No results found for this or any previous visit. ? ? ?_______________________________________________ ?Hospitalist was called for admission for   Closed fracture of neck of right femur, initial encounter ([place]) ?   ? ?The following Work up has been ordered so far: ? ?Orders Placed This Encounter  ?Procedures  ? DG Hip Unilat W or Wo Pelvis 2-3 Views Right  ? CT Head Wo Contrast  ? CT Cervical Spine Wo Contrast  ? DG Chest 1 View  ? DG Knee  Right Port  ? Basic metabolic panel  ? CBC with Differential  ? Protime-INR  ? Diet NPO time specified  ? Diet per anesthesia ENHANCED RECOVERY AFTER SURGERY (ERAS) Pathway.  Clear liquids after midnight until 4:30am. Finish prescribed drink (Pre-Surgery Ensure or G2 if patient is diabetic) by 4:30am. NPO except meds with a sip of water at 4:30am.  ? Low Sugar G2 for Diabetic Patients (355 ML)  [SUPPLIED BY DIETARY]  ? Consult to orthopedic surgery  ? Consult to hospitalist  ? ED EKG  ? Saline lock IV  ?  ? ?OTHER Significant initial  Findings: ? ?labs showing:  ?Recent Labs  ?Lab 01/09/22 ?1836  ?NA 140  ?K 4.0  ?CO2 25  ?GLUCOSE 146*  ?BUN 20  ?CREATININE 1.07*  ?CALCIUM 8.8*  ? ? ?Cr    Up from baseline see below ?Lab Results  ?Component Value Date  ? CREATININE 1.07 (H) 01/09/2022  ? CREATININE 0.81 04/22/2016  ? ? ?No results for input(s): AST, ALT, ALKPHOS, BILITOT, PROT, ALBUMIN in the last 168 hours. ?Lab Results  ?Component Value Date  ? CALCIUM 8.8 (L) 01/09/2022  ?  ?Plt: ?Lab Results  ?Component Value Date  ? PLT 247 01/09/2022  ? ?  ?Recent Labs  ?Lab 01/09/22 ?1836  ?WBC 10.1  ?NEUTROABS 9.0*  ?HGB 12.0  ?HCT 36.1  ?MCV 93.3  ?PLT 247  ? ? ?HG/HCT   stable,   ?   ?Component Value Date/Time  ? HGB 12.0 01/09/2022 1836  ? HCT 36.1 01/09/2022 1836  ? MCV 93.3 01/09/2022 1836  ?  ?   ?Cultures: ?No results found for: SDES, [place], [place], REPTSTATUS ?  ?Radiological Exams on Admission: ?DG Chest 1 View ? ?Result Date: 01/09/2022 ?CLINICAL DATA:  Unwitnessed fall. EXAM: CHEST  1 VIEW COMPARISON:  None. FINDINGS: The heart size and mediastinal contours are within normal limits. Both lungs are clear. The visualized skeletal structures are unremarkable. IMPRESSION: No active disease. Electronically Signed   By: Marijo Conception M.D.   On: 01/09/2022 19:34  ? ?CT Head Wo Contrast ? ?Result Date: 01/09/2022 ?CLINICAL DATA:  Fall EXAM: CT HEAD WITHOUT CONTRAST CT CERVICAL SPINE WITHOUT CONTRAST TECHNIQUE:  Multidetector CT imaging of the head and cervical spine was performed  following the standard protocol without intravenous contrast. Multiplanar CT image reconstructions of the cervical spine were also generated. RADIATION DOSE REDUCTION: This exam was performed according to the departmental dose-optimization program which includes automated exposure control, adjustment of the mA and/or kV according to patient size and/or use of iterative reconstruction technique. COMPARISON:  Head CT dated April 22, 2016 FINDINGS: CT HEAD FINDINGS Brain: Global atrophy and chronic white matter ischemic change. Encephalomalacia of the right temporal lobe no evidence of acute infarction, hemorrhage, hydrocephalus, extra-axial collection or mass lesion/mass effect. Vascular: No hyperdense vessel or unexpected calcification. Skull: Normal. Negative for fracture or focal lesion. Sinuses/Orbits: No acute finding. Other: None. CT CERVICAL SPINE FINDINGS Alignment: Normal. Skull base and vertebrae: No acute fracture. No primary bone lesion or focal pathologic process. Soft tissues and spinal canal: No prevertebral fluid or swelling. No visible canal hematoma. Disc levels:  Mild multilevel degenerative changes. Upper chest: Negative. Other: None. IMPRESSION: 1. No acute intracranial abnormality. 2. No evidence of traumatic malalignment or acute cervical spine fracture. Electronically Signed   By: Yetta Glassman M.D.   On: 01/09/2022 19:22  ? ?CT Cervical Spine Wo Contrast ? ?Result Date: 01/09/2022 ?CLINICAL DATA:  Fall EXAM: CT HEAD WITHOUT CONTRAST CT CERVICAL SPINE WITHOUT CONTRAST TECHNIQUE: Multidetector CT imaging of the head and cervical spine was performed following the standard protocol without intravenous contrast. Multiplanar CT image reconstructions of the cervical spine were also generated. RADIATION DOSE REDUCTION: This exam was performed according to the departmental dose-optimization program which includes automated exposure  control, adjustment of the mA and/or kV according to patient size and/or use of iterative reconstruction technique. COMPARISON:  Head CT dated April 22, 2016 FINDINGS: CT HEAD FINDINGS Brain: Global atrophy and chron

## 2022-01-09 NOTE — Assessment & Plan Note (Addendum)
-   management as per orthopedics,  plan to operate   in  a.m. ?  Keep nothing by mouth post midnight. ?Patient  not on anticoagulation on hold ?Ordered type and screen,  order a vitamin D level ? ?Patient at baseline  able to walk  100 feet  ? significant  generalize fatigue and deconditioning   ?   Patient denies any chest pain or shortness of breath currently and/or with exertion, ?  ECG showing no evidence of acute ischemia ? no known history of coronary artery disease, COPD   Liver failure   CKD ? ?Given advanced age patient is at least moderate  risk   which has been discussed with family but at this point no furthther cardiac workup is indicated. ? order CE   ?Given hx of diastolic CHF will order echo ? ?

## 2022-01-09 NOTE — ED Notes (Signed)
Patients husband called that the patient has had a heart murmer in the past.  ?Jorje Guild: [phone] ?

## 2022-01-09 NOTE — ED Provider Notes (Signed)
Emergency Department Provider Note   I have reviewed the triage vital signs and the nursing notes.   HISTORY  Chief Complaint Fall and Hip Injury   HPI Karen Pearson is a 88 y.o. female with past history reviewed below including dementia presents to the emergency department by EMS after an unwitnessed fall at home.  Patient does not recall the incident or the mechanics of the fall.  She is complaining of some pain mainly in the right groin.  EMS report shortening and rotation.  They applied a sheet tied around the pelvis as well as a towel roll around the neck for support.  They deny any anticoagulation.  Patient denies any other complaints.   Dementia limiting history.   Past Medical History:  Diagnosis Date   Cancer (HCC)    High cholesterol    Hypertension    Melanoma (HCC)    Syncope 04/22/2016   Thyroid disease     Review of Systems  Constitutional: No fever/chills Cardiovascular: Denies chest pain. Respiratory: Denies shortness of breath. Gastrointestinal: No abdominal pain. Musculoskeletal: Right hip pain.  Skin: Negative for rash. Neurological: Negative for headaches.  ____________________________________________   PHYSICAL EXAM:  VITAL SIGNS: ED Triage Vitals  Enc Vitals Group     BP 01/09/22 1815 (!) 170/83     Pulse Rate 01/09/22 1815 84     Resp 01/09/22 1815 20     Temp 01/09/22 1815 98.1 F (36.7 C)     Temp Source 01/09/22 1815 Oral     SpO2 01/09/22 1815 95 %     Weight 01/09/22 1819 125 lb (56.7 kg)   Constitutional: Alert. Well appearing and in no acute distress. Eyes: Conjunctivae are normal.  Head: Atraumatic. Nose: No congestion/rhinnorhea. Mouth/Throat: Mucous membranes are moist.   Neck: No stridor. No cervical spine tenderness to palpation. Cardiovascular: Normal rate, regular rhythm. Good peripheral circulation. Grossly normal heart sounds.   Respiratory: Normal respiratory effort.  No retractions. Lungs CTAB. Gastrointestinal:  Soft and nontender. No distention.  Musculoskeletal: Slight shortening and external rotation of the right hip.  No knee tenderness bilaterally.  No pain with range of motion of the left hip.  Normal range of motion of the bilateral upper extremities without bony tenderness in the elbows, shoulders, wrists. Neurologic:  Normal speech and language. No gross focal neurologic deficits are appreciated.  Skin:  Skin is warm, dry and intact. No rash noted.  ____________________________________________   LABS (all labs ordered are listed, but only abnormal results are displayed)  Labs Reviewed  BASIC METABOLIC PANEL - Abnormal; Notable for the following components:      Result Value   Glucose, Bld 146 (*)    Creatinine, Ser 1.07 (*)    Calcium 8.8 (*)    GFR, Estimated 48 (*)    All other components within normal limits  CBC WITH DIFFERENTIAL/PLATELET - Abnormal; Notable for the following components:   Neutro Abs 9.0 (*)    Lymphs Abs 0.6 (*)    All other components within normal limits  URINALYSIS, ROUTINE W REFLEX MICROSCOPIC - Abnormal; Notable for the following components:   Color, Urine STRAW (*)    Hgb urine dipstick SMALL (*)    Ketones, ur 20 (*)    All other components within normal limits  RESP PANEL BY RT-PCR (FLU A&B, COVID) ARPGX2  PROTIME-INR  SODIUM, URINE, RANDOM  CREATININE, URINE, RANDOM  CK  HEPATIC FUNCTION PANEL  MAGNESIUM  PHOSPHORUS  TSH  OSMOLALITY, URINE  TROPONIN  I (HIGH SENSITIVITY)   ____________________________________________  EKG   EKG Interpretation  Date/Time:  Monday January 09 2022 19:31:10 EDT Ventricular Rate:  85 PR Interval:  62 QRS Duration: 94 QT Interval:  398 QTC Calculation: 476 R Axis:   54 Text Interpretation: Sinus rhythm Short PR interval Consider right atrial enlargement Abnormal R-wave progression, early transition Minimal ST depression, anterolateral leads Confirmed by Alona Bene [phone]) on 01/09/2022 8:43:50 PM         ____________________________________________  RADIOLOGY  DG Chest 1 View  Result Date: 01/09/2022 CLINICAL DATA:  Unwitnessed fall. EXAM: CHEST  1 VIEW COMPARISON:  None. FINDINGS: The heart size and mediastinal contours are within normal limits. Both lungs are clear. The visualized skeletal structures are unremarkable. IMPRESSION: No active disease. Electronically Signed   By: Lupita Raider M.D.   On: 01/09/2022 19:34   CT Head Wo Contrast  Result Date: 01/09/2022 CLINICAL DATA:  Fall EXAM: CT HEAD WITHOUT CONTRAST CT CERVICAL SPINE WITHOUT CONTRAST TECHNIQUE: Multidetector CT imaging of the head and cervical spine was performed following the standard protocol without intravenous contrast. Multiplanar CT image reconstructions of the cervical spine were also generated. RADIATION DOSE REDUCTION: This exam was performed according to the departmental dose-optimization program which includes automated exposure control, adjustment of the mA and/or kV according to patient size and/or use of iterative reconstruction technique. COMPARISON:  Head CT dated April 22, 2016 FINDINGS: CT HEAD FINDINGS Brain: Global atrophy and chronic white matter ischemic change. Encephalomalacia of the right temporal lobe no evidence of acute infarction, hemorrhage, hydrocephalus, extra-axial collection or mass lesion/mass effect. Vascular: No hyperdense vessel or unexpected calcification. Skull: Normal. Negative for fracture or focal lesion. Sinuses/Orbits: No acute finding. Other: None. CT CERVICAL SPINE FINDINGS Alignment: Normal. Skull base and vertebrae: No acute fracture. No primary bone lesion or focal pathologic process. Soft tissues and spinal canal: No prevertebral fluid or swelling. No visible canal hematoma. Disc levels:  Mild multilevel degenerative changes. Upper chest: Negative. Other: None. IMPRESSION: 1. No acute intracranial abnormality. 2. No evidence of traumatic malalignment or acute cervical spine fracture.  Electronically Signed   By: Allegra Lai M.D.   On: 01/09/2022 19:22   CT Cervical Spine Wo Contrast  Result Date: 01/09/2022 CLINICAL DATA:  Fall EXAM: CT HEAD WITHOUT CONTRAST CT CERVICAL SPINE WITHOUT CONTRAST TECHNIQUE: Multidetector CT imaging of the head and cervical spine was performed following the standard protocol without intravenous contrast. Multiplanar CT image reconstructions of the cervical spine were also generated. RADIATION DOSE REDUCTION: This exam was performed according to the departmental dose-optimization program which includes automated exposure control, adjustment of the mA and/or kV according to patient size and/or use of iterative reconstruction technique. COMPARISON:  Head CT dated April 22, 2016 FINDINGS: CT HEAD FINDINGS Brain: Global atrophy and chronic white matter ischemic change. Encephalomalacia of the right temporal lobe no evidence of acute infarction, hemorrhage, hydrocephalus, extra-axial collection or mass lesion/mass effect. Vascular: No hyperdense vessel or unexpected calcification. Skull: Normal. Negative for fracture or focal lesion. Sinuses/Orbits: No acute finding. Other: None. CT CERVICAL SPINE FINDINGS Alignment: Normal. Skull base and vertebrae: No acute fracture. No primary bone lesion or focal pathologic process. Soft tissues and spinal canal: No prevertebral fluid or swelling. No visible canal hematoma. Disc levels:  Mild multilevel degenerative changes. Upper chest: Negative. Other: None. IMPRESSION: 1. No acute intracranial abnormality. 2. No evidence of traumatic malalignment or acute cervical spine fracture. Electronically Signed   By: Aliene Altes.D.  On: 01/09/2022 19:22   DG Knee Right Port  Result Date: 01/09/2022 CLINICAL DATA:  Fall, right hip pain.  Right hip fracture. EXAM: PORTABLE RIGHT KNEE - 1-2 VIEW COMPARISON:  None. FINDINGS: No fracture or dislocation. Lateral view slightly limited by positioning. Moderate osteoarthritis with  joint space narrowing and spurring. Trace knee joint effusion. Small quadriceps tendon enthesophyte. IMPRESSION: 1. No fracture or dislocation of the right knee. 2. Moderate osteoarthritis. Electronically Signed   By: Narda Rutherford M.D.   On: 01/09/2022 20:52   DG Hip Unilat W or Wo Pelvis 2-3 Views Right  Result Date: 01/09/2022 CLINICAL DATA:  Right hip pain after fall. EXAM: DG HIP (WITH OR WITHOUT PELVIS) 2-3V RIGHT COMPARISON:  None. FINDINGS: Moderately displaced proximal right femoral neck fracture is noted. IMPRESSION: Moderately displaced proximal right femoral neck fracture. Electronically Signed   By: Lupita Raider M.D.   On: 01/09/2022 19:36    ____________________________________________   PROCEDURES  Procedure(s) performed:   Procedures  None ____________________________________________   INITIAL IMPRESSION / ASSESSMENT AND PLAN / ED COURSE  Pertinent labs & imaging results that were available during my care of the patient were reviewed by me and considered in my medical decision making (see chart for details).   This patient is Presenting for Evaluation of fall, which does require a range of treatment options, and is a complaint that involves a high risk of morbidity and mortality.  The Differential Diagnoses include head injury with SDH/SAH, hip fracture, hip dislocation, metabolic encephalopathy.   I did obtain Additional Historical Information from EMS and husband at bedside.  I decided to review pertinent External Data, and in summary no ED presentation at this facility since 2018.   Clinical Laboratory Tests Ordered, included COVID and flu which are negative.  Serum creatinine of 1.07 GFR 48.  No leukocytosis or anemia.  No UTI.  Radiologic Tests Ordered, included CT head, c-spine, hip x-ray, and CXR. I independently interpreted the images and agree with radiology interpretation.   Cardiac Monitor Tracing which shows NSR.   Social Determinants of Health  Risk no EtOH or drugs.   Consult complete with ortho, Dr. Linna Caprice. Plan for OR add on tomorrow.   Discussed patient's case with [facility] to request admission. Patient and family (if present) updated with plan.    Medical Decision Making: Summary:  Patient presents emergency department evaluation after unwitnessed fall at home.  She has shortening and external rotation of the right hip.  X-ray plan to evaluate for fracture versus dislocation.  She has good pulses and normal sensation in both feet.  No outward sign of head trauma but fall was unwitnessed and patient's history of dementia limits the evaluation.  CT imaging of the head and neck ordered along with chest x-ray and basic blood work.   Reevaluation with update and discussion with patient's husband at bedside. Plan for admit and ortho repair, likely tomorrow.   Disposition: discharge  ____________________________________________  FINAL CLINICAL IMPRESSION(S) / ED DIAGNOSES  Final diagnoses:  Closed fracture of neck of right femur, initial encounter [facility])    Note:  This document was prepared using Dragon voice recognition software and may include unintentional dictation errors.  Alona Bene, MD, [facility] Emergency Medicine    Smiley Birr, Arlyss Repress, MD 01/09/22 [phone]

## 2022-01-09 NOTE — Assessment & Plan Note (Signed)
-   Check TSH continue home medications at current dose ° °

## 2022-01-09 NOTE — Subjective & Objective (Signed)
Pt had a fall today with right groin pain unable to bear weight ?Unwitnessed by family were right there ?

## 2022-01-09 NOTE — Consult Note (Addendum)
? ? ?ORTHOPAEDIC CONSULTATION ? ?REQUESTING PHYSICIAN: Long, Wonda Olds, MD ? ?PCP:  Cari Caraway, MD ? ?Chief Complaint: Right hip injury ? ?HPI: ?Karen Pearson is a 88 y.o. female who complains of right hip pain and inability to weight bear after an unwitnessed ground level fall at home. She was brought to the ED via EMS where the x-rays showed a right hip fracture. She lives with her husband. Household ambulator. Denies other injuries.  ? ?Past Medical History:  ?Diagnosis Date  ? Cancer [facility])   ? High cholesterol   ? Hypertension   ? Melanoma ([place])   ? Thyroid disease   ? ?Past Surgical History:  ?Procedure Laterality Date  ? ABDOMINAL HYSTERECTOMY    ? EYE MUSCLE SURGERY    ? ?Social History  ? ?Socioeconomic History  ? Marital status: Single  ?  Spouse name: Not on file  ? Number of children: Not on file  ? Years of education: Not on file  ? Highest education level: Not on file  ?Occupational History  ? Not on file  ?Tobacco Use  ? Smoking status: Never  ? Smokeless tobacco: Never  ?Vaping Use  ? Vaping Use: Not on file  ?Substance and Sexual Activity  ? Alcohol use: No  ? Drug use: Not on file  ? Sexual activity: Not on file  ?Other Topics Concern  ? Not on file  ?Social History Narrative  ? Not on file  ? ?Social Determinants of Health  ? ?Financial Resource Strain: Not on file  ?Food Insecurity: Not on file  ?Transportation Needs: Not on file  ?Physical Activity: Not on file  ?Stress: Not on file  ?Social Connections: Not on file  ? ?No family history on file. ?Allergies  ?Allergen Reactions  ? Acetaminophen   ?  Other reaction(s): made her hyperactive  ? ?Prior to Admission medications   ?Medication Sig Start Date End Date Taking? Authorizing Provider  ?amLODipine (NORVASC) 5 MG tablet Take 5 mg by mouth daily. 03/31/16   [provider]  ?atorvastatin (LIPITOR) 10 MG tablet Take 10 mg by mouth daily. 03/28/16   [provider]  ?gabapentin (NEURONTIN) 300 MG capsule Take 300 mg by mouth at  bedtime as needed (PAIN).    [provider]  ?levothyroxine (SYNTHROID, LEVOTHROID) 75 MCG tablet Take 75 mcg by mouth daily before breakfast.    [provider]  ?naproxen sodium (ALEVE) 220 MG tablet Take 220 mg by mouth at bedtime as needed (PAIN).    [provider]  ?Vitamin D, Ergocalciferol, (DRISDOL) 50000 units CAPS capsule Take 50,000 Units by mouth once a week. 02/16/16   [provider]  ? ?DG Chest 1 View ? ?Result Date: 01/09/2022 ?CLINICAL DATA:  Unwitnessed fall. EXAM: CHEST  1 VIEW COMPARISON:  None. FINDINGS: The heart size and mediastinal contours are within normal limits. Both lungs are clear. The visualized skeletal structures are unremarkable. IMPRESSION: No active disease. Electronically Signed   By: Marijo Conception M.D.   On: 01/09/2022 19:34  ? ?CT Head Wo Contrast ? ?Result Date: 01/09/2022 ?CLINICAL DATA:  Fall EXAM: CT HEAD WITHOUT CONTRAST CT CERVICAL SPINE WITHOUT CONTRAST TECHNIQUE: Multidetector CT imaging of the head and cervical spine was performed following the standard protocol without intravenous contrast. Multiplanar CT image reconstructions of the cervical spine were also generated. RADIATION DOSE REDUCTION: This exam was performed according to the departmental dose-optimization program which includes automated exposure control, adjustment of the mA and/or kV according  to patient size and/or use of iterative reconstruction technique. COMPARISON:  Head CT dated April 22, 2016 FINDINGS: CT HEAD FINDINGS Brain: Global atrophy and chronic white matter ischemic change. Encephalomalacia of the right temporal lobe no evidence of acute infarction, hemorrhage, hydrocephalus, extra-axial collection or mass lesion/mass effect. Vascular: No hyperdense vessel or unexpected calcification. Skull: Normal. Negative for fracture or focal lesion. Sinuses/Orbits: No acute finding. Other: None. CT CERVICAL SPINE FINDINGS Alignment: Normal. Skull base and vertebrae:  No acute fracture. No primary bone lesion or focal pathologic process. Soft tissues and spinal canal: No prevertebral fluid or swelling. No visible canal hematoma. Disc levels:  Mild multilevel degenerative changes. Upper chest: Negative. Other: None. IMPRESSION: 1. No acute intracranial abnormality. 2. No evidence of traumatic malalignment or acute cervical spine fracture. Electronically Signed   By: Yetta Glassman M.D.   On: 01/09/2022 19:22  ? ?CT Cervical Spine Wo Contrast ? ?Result Date: 01/09/2022 ?CLINICAL DATA:  Fall EXAM: CT HEAD WITHOUT CONTRAST CT CERVICAL SPINE WITHOUT CONTRAST TECHNIQUE: Multidetector CT imaging of the head and cervical spine was performed following the standard protocol without intravenous contrast. Multiplanar CT image reconstructions of the cervical spine were also generated. RADIATION DOSE REDUCTION: This exam was performed according to the departmental dose-optimization program which includes automated exposure control, adjustment of the mA and/or kV according to patient size and/or use of iterative reconstruction technique. COMPARISON:  Head CT dated April 22, 2016 FINDINGS: CT HEAD FINDINGS Brain: Global atrophy and chronic white matter ischemic change. Encephalomalacia of the right temporal lobe no evidence of acute infarction, hemorrhage, hydrocephalus, extra-axial collection or mass lesion/mass effect. Vascular: No hyperdense vessel or unexpected calcification. Skull: Normal. Negative for fracture or focal lesion. Sinuses/Orbits: No acute finding. Other: None. CT CERVICAL SPINE FINDINGS Alignment: Normal. Skull base and vertebrae: No acute fracture. No primary bone lesion or focal pathologic process. Soft tissues and spinal canal: No prevertebral fluid or swelling. No visible canal hematoma. Disc levels:  Mild multilevel degenerative changes. Upper chest: Negative. Other: None. IMPRESSION: 1. No acute intracranial abnormality. 2. No evidence of traumatic malalignment or acute  cervical spine fracture. Electronically Signed   By: Yetta Glassman M.D.   On: 01/09/2022 19:22  ? ?DG Hip Unilat W or Wo Pelvis 2-3 Views Right ? ?Result Date: 01/09/2022 ?CLINICAL DATA:  Right hip pain after fall. EXAM: DG HIP (WITH OR WITHOUT PELVIS) 2-3V RIGHT COMPARISON:  None. FINDINGS: Moderately displaced proximal right femoral neck fracture is noted. IMPRESSION: Moderately displaced proximal right femoral neck fracture. Electronically Signed   By: Marijo Conception M.D.   On: 01/09/2022 19:36   ? ?Positive ROS: All other systems have been reviewed and were otherwise negative with the exception of those mentioned in the HPI and as above. ? ?Physical Exam: ?General: Alert, no acute distress ?Cardiovascular: No pedal edema ?Respiratory: No cyanosis, no use of accessory musculature ?GI: No organomegaly, abdomen is soft and non-tender ?Skin: No lesions in the area of chief complaint ?Neurologic: Sensation intact distally ?Psychiatric: Demented and confused ?Lymphatic: No axillary or cervical lymphadenopathy ? ?MUSCULOSKELETAL: Right hip skin intact. Shortened and externally rotated. Unable to assess motor and sensory function due to AMS. Pulses 2+ pedal ? ?Assessment: ?Displaced right femoral neck fracture ?Dementia ? ?Plan: ?Unstable right hip fracture. Will require surgical treatment for pain control and to allow immediate mobilization out of bed. [facility] has been consulted for admission, perioperative medical optimization. Plan for right hip hemiarthroplasty. Hold chemical DVT prophylaxis. NPO after midnight. We attempted  to reach husband Maimuna Leaman phone number [phone] unsuccessfully. ? ? ?Charlott Rakes, PA-C ? ? ? ?01/09/2022 ?8:52 PM ? ? ? ? ? ?ADDENDUM: ?I spoke with the patient's husband on the phone. We discussed the R/B/A of R hip hemiarthroplasty vs THA. All questions solicited and answered. ? ?The risks, benefits, and alternatives were discussed with the patient's husband. There are risks associated  with the surgery including, but not limited to, problems with anesthesia (death), infection, instability (giving out of the joint), dislocation, differences in leg length/angulation/rotation, fracture

## 2022-01-09 NOTE — ED Notes (Signed)
Report given to Lubertha South, RN. IP RN verbalized ready to receive patient.  ?

## 2022-01-09 NOTE — Assessment & Plan Note (Signed)
May need ot pt eval ?

## 2022-01-09 NOTE — H&P (View-Only) (Signed)
? ? ?ORTHOPAEDIC CONSULTATION ? ?REQUESTING PHYSICIAN: Long, Wonda Olds, MD ? ?PCP:  Cari Caraway, MD ? ?Chief Complaint: Right hip injury ? ?HPI: ?Karen Pearson is a 88 y.o. female who complains of right hip pain and inability to weight bear after an unwitnessed ground level fall at home. She was brought to the ED via EMS where the x-rays showed a right hip fracture. She lives with her husband. Household ambulator. Denies other injuries.  ? ?Past Medical History:  ?Diagnosis Date  ? Cancer [facility])   ? High cholesterol   ? Hypertension   ? Melanoma ([place])   ? Thyroid disease   ? ?Past Surgical History:  ?Procedure Laterality Date  ? ABDOMINAL HYSTERECTOMY    ? EYE MUSCLE SURGERY    ? ?Social History  ? ?Socioeconomic History  ? Marital status: Single  ?  Spouse name: Not on file  ? Number of children: Not on file  ? Years of education: Not on file  ? Highest education level: Not on file  ?Occupational History  ? Not on file  ?Tobacco Use  ? Smoking status: Never  ? Smokeless tobacco: Never  ?Vaping Use  ? Vaping Use: Not on file  ?Substance and Sexual Activity  ? Alcohol use: No  ? Drug use: Not on file  ? Sexual activity: Not on file  ?Other Topics Concern  ? Not on file  ?Social History Narrative  ? Not on file  ? ?Social Determinants of Health  ? ?Financial Resource Strain: Not on file  ?Food Insecurity: Not on file  ?Transportation Needs: Not on file  ?Physical Activity: Not on file  ?Stress: Not on file  ?Social Connections: Not on file  ? ?No family history on file. ?Allergies  ?Allergen Reactions  ? Acetaminophen   ?  Other reaction(s): made her hyperactive  ? ?Prior to Admission medications   ?Medication Sig Start Date End Date Taking? Authorizing Provider  ?amLODipine (NORVASC) 5 MG tablet Take 5 mg by mouth daily. 03/31/16   [provider]  ?atorvastatin (LIPITOR) 10 MG tablet Take 10 mg by mouth daily. 03/28/16   [provider]  ?gabapentin (NEURONTIN) 300 MG capsule Take 300 mg by mouth at  bedtime as needed (PAIN).    [provider]  ?levothyroxine (SYNTHROID, LEVOTHROID) 75 MCG tablet Take 75 mcg by mouth daily before breakfast.    [provider]  ?naproxen sodium (ALEVE) 220 MG tablet Take 220 mg by mouth at bedtime as needed (PAIN).    [provider]  ?Vitamin D, Ergocalciferol, (DRISDOL) 50000 units CAPS capsule Take 50,000 Units by mouth once a week. 02/16/16   [provider]  ? ?DG Chest 1 View ? ?Result Date: 01/09/2022 ?CLINICAL DATA:  Unwitnessed fall. EXAM: CHEST  1 VIEW COMPARISON:  None. FINDINGS: The heart size and mediastinal contours are within normal limits. Both lungs are clear. The visualized skeletal structures are unremarkable. IMPRESSION: No active disease. Electronically Signed   By: Marijo Conception M.D.   On: 01/09/2022 19:34  ? ?CT Head Wo Contrast ? ?Result Date: 01/09/2022 ?CLINICAL DATA:  Fall EXAM: CT HEAD WITHOUT CONTRAST CT CERVICAL SPINE WITHOUT CONTRAST TECHNIQUE: Multidetector CT imaging of the head and cervical spine was performed following the standard protocol without intravenous contrast. Multiplanar CT image reconstructions of the cervical spine were also generated. RADIATION DOSE REDUCTION: This exam was performed according to the departmental dose-optimization program which includes automated exposure control, adjustment of the mA and/or kV according  to patient size and/or use of iterative reconstruction technique. COMPARISON:  Head CT dated April 22, 2016 FINDINGS: CT HEAD FINDINGS Brain: Global atrophy and chronic white matter ischemic change. Encephalomalacia of the right temporal lobe no evidence of acute infarction, hemorrhage, hydrocephalus, extra-axial collection or mass lesion/mass effect. Vascular: No hyperdense vessel or unexpected calcification. Skull: Normal. Negative for fracture or focal lesion. Sinuses/Orbits: No acute finding. Other: None. CT CERVICAL SPINE FINDINGS Alignment: Normal. Skull base and vertebrae:  No acute fracture. No primary bone lesion or focal pathologic process. Soft tissues and spinal canal: No prevertebral fluid or swelling. No visible canal hematoma. Disc levels:  Mild multilevel degenerative changes. Upper chest: Negative. Other: None. IMPRESSION: 1. No acute intracranial abnormality. 2. No evidence of traumatic malalignment or acute cervical spine fracture. Electronically Signed   By: Yetta Glassman M.D.   On: 01/09/2022 19:22  ? ?CT Cervical Spine Wo Contrast ? ?Result Date: 01/09/2022 ?CLINICAL DATA:  Fall EXAM: CT HEAD WITHOUT CONTRAST CT CERVICAL SPINE WITHOUT CONTRAST TECHNIQUE: Multidetector CT imaging of the head and cervical spine was performed following the standard protocol without intravenous contrast. Multiplanar CT image reconstructions of the cervical spine were also generated. RADIATION DOSE REDUCTION: This exam was performed according to the departmental dose-optimization program which includes automated exposure control, adjustment of the mA and/or kV according to patient size and/or use of iterative reconstruction technique. COMPARISON:  Head CT dated April 22, 2016 FINDINGS: CT HEAD FINDINGS Brain: Global atrophy and chronic white matter ischemic change. Encephalomalacia of the right temporal lobe no evidence of acute infarction, hemorrhage, hydrocephalus, extra-axial collection or mass lesion/mass effect. Vascular: No hyperdense vessel or unexpected calcification. Skull: Normal. Negative for fracture or focal lesion. Sinuses/Orbits: No acute finding. Other: None. CT CERVICAL SPINE FINDINGS Alignment: Normal. Skull base and vertebrae: No acute fracture. No primary bone lesion or focal pathologic process. Soft tissues and spinal canal: No prevertebral fluid or swelling. No visible canal hematoma. Disc levels:  Mild multilevel degenerative changes. Upper chest: Negative. Other: None. IMPRESSION: 1. No acute intracranial abnormality. 2. No evidence of traumatic malalignment or acute  cervical spine fracture. Electronically Signed   By: Yetta Glassman M.D.   On: 01/09/2022 19:22  ? ?DG Hip Unilat W or Wo Pelvis 2-3 Views Right ? ?Result Date: 01/09/2022 ?CLINICAL DATA:  Right hip pain after fall. EXAM: DG HIP (WITH OR WITHOUT PELVIS) 2-3V RIGHT COMPARISON:  None. FINDINGS: Moderately displaced proximal right femoral neck fracture is noted. IMPRESSION: Moderately displaced proximal right femoral neck fracture. Electronically Signed   By: Marijo Conception M.D.   On: 01/09/2022 19:36   ? ?Positive ROS: All other systems have been reviewed and were otherwise negative with the exception of those mentioned in the HPI and as above. ? ?Physical Exam: ?General: Alert, no acute distress ?Cardiovascular: No pedal edema ?Respiratory: No cyanosis, no use of accessory musculature ?GI: No organomegaly, abdomen is soft and non-tender ?Skin: No lesions in the area of chief complaint ?Neurologic: Sensation intact distally ?Psychiatric: Demented and confused ?Lymphatic: No axillary or cervical lymphadenopathy ? ?MUSCULOSKELETAL: Right hip skin intact. Shortened and externally rotated. Unable to assess motor and sensory function due to AMS. Pulses 2+ pedal ? ?Assessment: ?Displaced right femoral neck fracture ?Dementia ? ?Plan: ?Unstable right hip fracture. Will require surgical treatment for pain control and to allow immediate mobilization out of bed. [facility] has been consulted for admission, perioperative medical optimization. Plan for right hip hemiarthroplasty. Hold chemical DVT prophylaxis. NPO after midnight. We attempted  to reach husband Autry Droege phone number [phone] unsuccessfully. ? ? ?Charlott Rakes, PA-C ? ? ? ?01/09/2022 ?8:52 PM ? ? ? ? ? ?ADDENDUM: ?I spoke with the patient's husband on the phone. We discussed the R/B/A of R hip hemiarthroplasty vs THA. All questions solicited and answered. ? ?The risks, benefits, and alternatives were discussed with the patient's husband. There are risks associated  with the surgery including, but not limited to, problems with anesthesia (death), infection, instability (giving out of the joint), dislocation, differences in leg length/angulation/rotation, fracture

## 2022-01-10 ENCOUNTER — Encounter (HOSPITAL_COMMUNITY)
Admission: EM | Disposition: A | Discharge status: Skilled Nursing Facility | Payer: Self-pay | Source: Home / Self Care | Attending: Internal Medicine

## 2022-01-10 ENCOUNTER — Inpatient Hospital Stay (HOSPITAL_COMMUNITY): Payer: Medicare Other

## 2022-01-10 ENCOUNTER — Inpatient Hospital Stay (HOSPITAL_COMMUNITY): Payer: Medicare Other | Admitting: Anesthesiology

## 2022-01-10 ENCOUNTER — Encounter (HOSPITAL_COMMUNITY): Payer: Self-pay | Admitting: Internal Medicine

## 2022-01-10 ENCOUNTER — Inpatient Hospital Stay (HOSPITAL_COMMUNITY)
Admit: 2022-01-10 | Discharge: 2022-01-10 | Disposition: A | Discharge status: Home / Self Care | Payer: Medicare Other | Attending: Anesthesiology | Admitting: Anesthesiology

## 2022-01-10 ENCOUNTER — Other Ambulatory Visit: Payer: Self-pay

## 2022-01-10 ENCOUNTER — Other Ambulatory Visit (HOSPITAL_COMMUNITY): Payer: Medicare Other

## 2022-01-10 DIAGNOSIS — F03918 Unspecified dementia, unspecified severity, with other behavioral disturbance: Secondary | ICD-10-CM | POA: Diagnosis not present

## 2022-01-10 DIAGNOSIS — E039 Hypothyroidism, unspecified: Secondary | ICD-10-CM

## 2022-01-10 DIAGNOSIS — R739 Hyperglycemia, unspecified: Secondary | ICD-10-CM

## 2022-01-10 DIAGNOSIS — R296 Repeated falls: Secondary | ICD-10-CM

## 2022-01-10 DIAGNOSIS — I10 Essential (primary) hypertension: Secondary | ICD-10-CM

## 2022-01-10 DIAGNOSIS — I5032 Chronic diastolic (congestive) heart failure: Secondary | ICD-10-CM | POA: Diagnosis not present

## 2022-01-10 DIAGNOSIS — M1990 Unspecified osteoarthritis, unspecified site: Secondary | ICD-10-CM

## 2022-01-10 DIAGNOSIS — D649 Anemia, unspecified: Secondary | ICD-10-CM

## 2022-01-10 DIAGNOSIS — S72001A Fracture of unspecified part of neck of right femur, initial encounter for closed fracture: Secondary | ICD-10-CM

## 2022-01-10 HISTORY — PX: TOTAL HIP ARTHROPLASTY: SHX124

## 2022-01-10 LAB — CBC
HCT: 35.3 % — ABNORMAL LOW (ref 36.0–46.0)
Hemoglobin: 11.7 g/dL — ABNORMAL LOW (ref 12.0–15.0)
MCH: 30.5 pg (ref 26.0–34.0)
MCHC: 33.1 g/dL (ref 30.0–36.0)
MCV: 91.9 fL (ref 80.0–100.0)
Platelets: 258 10*3/uL (ref 150–400)
RBC: 3.84 MIL/uL — ABNORMAL LOW (ref 3.87–5.11)
RDW: 12.9 % (ref 11.5–15.5)
WBC: 10.3 10*3/uL (ref 4.0–10.5)
nRBC: 0 % (ref 0.0–0.2)

## 2022-01-10 LAB — BASIC METABOLIC PANEL
Anion gap: 14 (ref 5–15)
BUN: 19 mg/dL (ref 8–23)
CO2: 18 mmol/L — ABNORMAL LOW (ref 22–32)
Calcium: 8.8 mg/dL — ABNORMAL LOW (ref 8.9–10.3)
Chloride: 104 mmol/L (ref 98–111)
Creatinine, Ser: 0.92 mg/dL (ref 0.44–1.00)
GFR, Estimated: 58 mL/min — ABNORMAL LOW (ref >60)
Glucose, Bld: 157 mg/dL — ABNORMAL HIGH (ref 70–99)
Potassium: 3.7 mmol/L (ref 3.5–5.1)
Sodium: 136 mmol/L (ref 135–145)

## 2022-01-10 LAB — TYPE AND SCREEN
ABO/RH(D): A NEG
Antibody Screen: NEGATIVE

## 2022-01-10 LAB — TSH: TSH: 3.857 u[IU]/mL (ref 0.350–4.500)

## 2022-01-10 LAB — VITAMIN D 25 HYDROXY (VIT D DEFICIENCY, FRACTURES): Vit D, 25-Hydroxy: 37.78 ng/mL (ref 30–100)

## 2022-01-10 LAB — ALBUMIN: Albumin: 3.8 g/dL (ref 3.5–5.0)

## 2022-01-10 LAB — OSMOLALITY, URINE: Osmolality, Ur: 581 mOsm/kg (ref 300–900)

## 2022-01-10 LAB — ABO/RH: ABO/RH(D): A NEG

## 2022-01-10 LAB — MRSA NEXT GEN BY PCR, NASAL: MRSA by PCR Next Gen: DETECTED — AB

## 2022-01-10 SURGERY — ARTHROPLASTY, HIP, TOTAL, ANTERIOR APPROACH
Anesthesia: Spinal | Site: Hip | Laterality: Right

## 2022-01-10 MED ORDER — HYDRALAZINE HCL 20 MG/ML IJ SOLN: 5.0000 mg | Freq: Three times a day (TID) | INTRAMUSCULAR | Status: DC | PRN

## 2022-01-10 MED ORDER — LIDOCAINE HCL (CARDIAC) PF 100 MG/5ML IV SOSY
PREFILLED_SYRINGE | INTRAVENOUS | Status: DC | PRN
  Administered 2022-01-10: 20 mg via INTRAVENOUS

## 2022-01-10 MED ORDER — LIDOCAINE HCL (PF) 2 % IJ SOLN
INTRAMUSCULAR | Status: AC
  Filled 2022-01-10: qty 5

## 2022-01-10 MED ORDER — WATER FOR IRRIGATION, STERILE IR SOLN
Status: DC | PRN
  Administered 2022-01-10: 2000 mL

## 2022-01-10 MED ORDER — PHENYLEPHRINE HCL (PRESSORS) 10 MG/ML IV SOLN
INTRAVENOUS | Status: AC
  Filled 2022-01-10: qty 1

## 2022-01-10 MED ORDER — ROPIVACAINE HCL 5 MG/ML IJ SOLN
INTRAMUSCULAR | Status: DC | PRN
  Administered 2022-01-10: 25 mL via PERINEURAL

## 2022-01-10 MED ORDER — ACETAMINOPHEN 325 MG PO TABS: 650.0000 mg | ORAL_TABLET | Freq: Four times a day (QID) | ORAL | Status: DC | PRN

## 2022-01-10 MED ORDER — DEXAMETHASONE SODIUM PHOSPHATE 10 MG/ML IJ SOLN
INTRAMUSCULAR | Status: DC | PRN
  Administered 2022-01-10: 4 mg via INTRAVENOUS

## 2022-01-10 MED ORDER — ENSURE ENLIVE PO LIQD
237.0000 mL | Freq: Two times a day (BID) | ORAL | Status: DC
  Administered 2022-01-11 – 2022-01-12 (×4): 237 mL via ORAL

## 2022-01-10 MED ORDER — CHLORHEXIDINE GLUCONATE CLOTH 2 % EX PADS
6.0000 | MEDICATED_PAD | Freq: Every day | CUTANEOUS | Status: DC
  Administered 2022-01-11: 6 via TOPICAL

## 2022-01-10 MED ORDER — BUPIVACAINE IN DEXTROSE 0.75-8.25 % IT SOLN
INTRATHECAL | Status: DC | PRN
  Administered 2022-01-10: 12 mg via INTRATHECAL

## 2022-01-10 MED ORDER — ONDANSETRON HCL 4 MG/2ML IJ SOLN
INTRAMUSCULAR | Status: DC | PRN
  Administered 2022-01-10: 4 mg via INTRAVENOUS

## 2022-01-10 MED ORDER — BUPIVACAINE-EPINEPHRINE (PF) 0.25% -1:200000 IJ SOLN
INTRAMUSCULAR | Status: AC
  Filled 2022-01-10: qty 30

## 2022-01-10 MED ORDER — BUPIVACAINE-EPINEPHRINE 0.25% -1:200000 IJ SOLN
INTRAMUSCULAR | Status: DC | PRN
  Administered 2022-01-10: 30 mL

## 2022-01-10 MED ORDER — HYDROXYZINE PAMOATE 25 MG PO CAPS: 25.0000 mg | ORAL_CAPSULE | Freq: Every day | ORAL | Status: DC

## 2022-01-10 MED ORDER — HYDROXYZINE HCL 25 MG PO TABS
25.0000 mg | ORAL_TABLET | Freq: Every day | ORAL | Status: DC
  Administered 2022-01-10 – 2022-01-11 (×2): 25 mg via ORAL
  Filled 2022-01-10 (×2): qty 1

## 2022-01-10 MED ORDER — ISOPROPYL ALCOHOL 70 % SOLN
Status: DC | PRN
  Administered 2022-01-10: 1 via TOPICAL

## 2022-01-10 MED ORDER — PHENYLEPHRINE HCL-NACL 20-0.9 MG/250ML-% IV SOLN
INTRAVENOUS | Status: DC | PRN
  Administered 2022-01-10: 30 ug/min via INTRAVENOUS

## 2022-01-10 MED ORDER — PROPOFOL 10 MG/ML IV BOLUS
INTRAVENOUS | Status: DC | PRN
  Administered 2022-01-10: 10 mg via INTRAVENOUS
  Administered 2022-01-10: 20 mg via INTRAVENOUS
  Administered 2022-01-10 (×5): 10 mg via INTRAVENOUS
  Administered 2022-01-10: 20 mg via INTRAVENOUS

## 2022-01-10 MED ORDER — KETOROLAC TROMETHAMINE 30 MG/ML IJ SOLN
INTRAMUSCULAR | Status: DC | PRN
  Administered 2022-01-10: 30 mg via INTRAVENOUS

## 2022-01-10 MED ORDER — KETOROLAC TROMETHAMINE 30 MG/ML IJ SOLN
INTRAMUSCULAR | Status: AC
  Filled 2022-01-10: qty 1

## 2022-01-10 MED ORDER — FENTANYL CITRATE PF 50 MCG/ML IJ SOSY: 25.0000 ug | PREFILLED_SYRINGE | INTRAMUSCULAR | Status: DC | PRN

## 2022-01-10 MED ORDER — SODIUM CHLORIDE 0.9 % IR SOLN
Status: DC | PRN
  Administered 2022-01-10: 1000 mL
  Administered 2022-01-10: 3000 mL
  Administered 2022-01-10: 1000 mL

## 2022-01-10 MED ORDER — PROPOFOL 10 MG/ML IV BOLUS
INTRAVENOUS | Status: AC
  Filled 2022-01-10: qty 20

## 2022-01-10 MED ORDER — SODIUM CHLORIDE (PF) 0.9 % IJ SOLN
INTRAMUSCULAR | Status: AC
  Filled 2022-01-10: qty 30

## 2022-01-10 MED ORDER — SODIUM CHLORIDE 0.9 % IV SOLN: INTRAVENOUS | Status: AC

## 2022-01-10 MED ORDER — EPHEDRINE SULFATE-NACL 50-0.9 MG/10ML-% IV SOSY
PREFILLED_SYRINGE | INTRAVENOUS | Status: DC | PRN
  Administered 2022-01-10: 5 mg via INTRAVENOUS

## 2022-01-10 MED ORDER — FENTANYL CITRATE PF 50 MCG/ML IJ SOSY
25.0000 ug | PREFILLED_SYRINGE | Freq: Once | INTRAMUSCULAR | Status: AC
  Administered 2022-01-10: 25 ug via INTRAVENOUS
  Filled 2022-01-10: qty 2

## 2022-01-10 MED ORDER — LACTATED RINGERS IV SOLN: INTRAVENOUS | Status: DC

## 2022-01-10 MED ORDER — SODIUM CHLORIDE (PF) 0.9 % IJ SOLN
INTRAMUSCULAR | Status: DC | PRN
  Administered 2022-01-10: 30 mL

## 2022-01-10 MED ORDER — PROPOFOL 500 MG/50ML IV EMUL
INTRAVENOUS | Status: DC | PRN
  Administered 2022-01-10: 50 ug/kg/min via INTRAVENOUS

## 2022-01-10 SURGICAL SUPPLY — 58 items
"TAPER COMPLETE PRIMARY FEMORAL POROUS COATED STEM " (Orthopedic Implant) IMPLANT
ADH SKN CLS APL DERMABOND .7 (GAUZE/BANDAGES/DRESSINGS) ×2
APL PRP STRL LF DISP 70% ISPRP (MISCELLANEOUS) ×1
BAG COUNTER SPONGE SURGICOUNT (BAG) ×1 IMPLANT
BAG DECANTER FOR FLEXI CONT (MISCELLANEOUS) IMPLANT
BAG SPEC THK2 15X12 ZIP CLS (MISCELLANEOUS)
BAG SPNG CNTER NS LX DISP (BAG) ×1
BAG ZIPLOCK 12X15 (MISCELLANEOUS) IMPLANT
CHLORAPREP W/TINT 26 (MISCELLANEOUS) ×2 IMPLANT
COVER PERINEAL POST (MISCELLANEOUS) ×2 IMPLANT
COVER SURGICAL LIGHT HANDLE (MISCELLANEOUS) ×2 IMPLANT
DERMABOND ADVANCED (GAUZE/BANDAGES/DRESSINGS) ×2
DERMABOND ADVANCED .7 DNX12 (GAUZE/BANDAGES/DRESSINGS) ×2 IMPLANT
DRAPE IMP U-DRAPE 54X76 (DRAPES) ×2 IMPLANT
DRAPE SHEET LG 3/4 BI-LAMINATE (DRAPES) ×6 IMPLANT
DRAPE STERI IOBAN 125X83 (DRAPES) ×2 IMPLANT
DRAPE U-SHAPE 47X51 STRL (DRAPES) ×4 IMPLANT
DRSG AQUACEL AG ADV 3.5X10 (GAUZE/BANDAGES/DRESSINGS) ×2 IMPLANT
ELECT REM PT RETURN 15FT ADLT (MISCELLANEOUS) ×2 IMPLANT
GAUZE SPONGE 4X4 12PLY STRL (GAUZE/BANDAGES/DRESSINGS) ×2 IMPLANT
GLOVE SURG ENC MOIS LTX SZ8.5 (GLOVE) ×4 IMPLANT
GLOVE SURG UNDER POLY LF SZ8.5 (GLOVE) ×2 IMPLANT
GOWN SPEC L3 XXLG W/TWL (GOWN DISPOSABLE) ×4 IMPLANT
HANDPIECE INTERPULSE COAX TIP (DISPOSABLE) ×2
HEAD MODULAR STD 28MM (Orthopedic Implant) ×1 IMPLANT
HOLDER FOLEY CATH W/STRAP (MISCELLANEOUS) ×2 IMPLANT
HOOD PEEL AWAY FLYTE STAYCOOL (MISCELLANEOUS) ×6 IMPLANT
KIT TURNOVER KIT A (KITS) IMPLANT
MANIFOLD NEPTUNE II (INSTRUMENTS) ×2 IMPLANT
MARKER SKIN DUAL TIP RULER LAB (MISCELLANEOUS) ×2 IMPLANT
NDL SAFETY ECLIPSE 18X1.5 (NEEDLE) ×1 IMPLANT
NDL SPNL 18GX3.5 QUINCKE PK (NEEDLE) ×1 IMPLANT
NEEDLE HYPO 18GX1.5 SHARP (NEEDLE) ×2
NEEDLE SPNL 18GX3.5 QUINCKE PK (NEEDLE) ×2 IMPLANT
PACK ANTERIOR HIP CUSTOM (KITS) ×2 IMPLANT
PENCIL SMOKE EVACUATOR (MISCELLANEOUS) IMPLANT
RINGBLOC BI POLAR 28X42MM (Orthopedic Implant) ×1 IMPLANT
SAW OSC TIP CART 19.5X105X1.3 (SAW) ×2 IMPLANT
SEALER BIPOLAR AQUA 6.0 (INSTRUMENTS) ×2 IMPLANT
SET HNDPC FAN SPRY TIP SCT (DISPOSABLE) ×1 IMPLANT
SOLUTION PRONTOSAN WOUND 350ML (IRRIGATION / IRRIGATOR) ×2 IMPLANT
SPIKE FLUID TRANSFER (MISCELLANEOUS) ×1 IMPLANT
SPONGE T-LAP 18X18 ~~LOC~~+RFID (SPONGE) ×6 IMPLANT
STAPLER INSORB 30 2030 C-SECTI (MISCELLANEOUS) IMPLANT
STAPLER VISISTAT 35W (STAPLE) ×1 IMPLANT
STEM FEM CMTLS 11X142 123D (Stem) ×1 IMPLANT
SUT MNCRL AB 3-0 PS2 18 (SUTURE) ×2 IMPLANT
SUT MON AB 2-0 CT1 36 (SUTURE) ×2 IMPLANT
SUT STRATAFIX PDO 1 14 VIOLET (SUTURE) ×2
SUT STRATFX PDO 1 14 VIOLET (SUTURE) ×1
SUT VIC AB 2-0 CT1 27 (SUTURE)
SUT VIC AB 2-0 CT1 TAPERPNT 27 (SUTURE) IMPLANT
SUTURE STRATFX PDO 1 14 VIOLET (SUTURE) ×1 IMPLANT
SYR 3ML LL SCALE MARK (SYRINGE) ×2 IMPLANT
TAPER COMPLETE PRIMARY FEMORAL POROUS COATED STEM (Orthopedic Implant) ×1 IMPLANT
TRAY FOLEY MTR SLVR 16FR STAT (SET/KITS/TRAYS/PACK) IMPLANT
TUBE SUCTION HIGH CAP CLEAR NV (SUCTIONS) ×2 IMPLANT
WATER STERILE IRR 1000ML POUR (IV SOLUTION) ×2 IMPLANT

## 2022-01-10 NOTE — Interval H&P Note (Signed)
History and Physical Interval Note: ? ?01/10/2022 ?5:11 PM ? ?Karen Pearson  has presented today for surgery, with the diagnosis of right femoral neck fracture.  The various methods of treatment have been discussed with the patient and family. After consideration of risks, benefits and other options for treatment, the patient has consented to  Procedure(s): ?HEMI HIP ARTHROPLASTY ANTERIOR APPROACH (Right) as a surgical intervention.  The patient's history has been reviewed, patient examined, no change in status, stable for surgery.  I have reviewed the patient's chart and labs.  Questions were answered to the patient's satisfaction.   ? ? ?Hilton Cork Jamareon Shimel ? ? ?

## 2022-01-10 NOTE — Progress Notes (Signed)
For PENG block by [facility] ?

## 2022-01-10 NOTE — Op Note (Signed)
OPERATIVE REPORT ? ?SURGEON: Rod Can, MD  ? ?ASSISTANT: Larene Pickett, PA-C ? ?PREOPERATIVE DIAGNOSIS: Displaced Right femoral neck fracture.  ? ?POSTOPERATIVE DIAGNOSIS: Displaced Right femoral neck fracture.  ? ?PROCEDURE: Right hip hemiarthroplasty, anterior approach.  ? ?IMPLANTS: Biomet Taperloc Complete Reduced Distal stem, size 11 x 142 mm, standard offset, with a 28 + 0 mm metal head ball and a 43 mm bipolar bearing. ? ?ANESTHESIA:  MAC, Regional, and Spinal ? ?ANTIBIOTICS: 2g ancef. ? ?ESTIMATED BLOOD LOSS:-50 mL   ? ?DRAINS: None. ? ?COMPLICATIONS: None ?  ?CONDITION: PACU - hemodynamically stable.  ? ?BRIEF CLINICAL NOTE: Karen Pearson is a 88 y.o. female with a displaced Right femoral neck fracture. The patient was admitted to the hospitalist service and underwent perioperative risk stratification and medical optimization. The risks, benefits, and alternatives to hemiarthroplasty were explained, and the patient elected to proceed. ? ?PROCEDURE IN DETAIL: The patient was taken to the operating room and general anesthesia was induced on the hospital bed.  The patient was then positioned on the Hana table.  All bony prominences were well padded.  The hip was prepped and draped in the normal sterile surgical fashion.  A time-out was called verifying side and site of surgery. Antibiotics were given within 60 minutes of beginning the procedure. ?  ?Bikini incision was made, and the direct anterior approach to the hip was performed through the Hueter interval.  Superficial dissection was carried out lateral to the ASIS. Lateral femoral circumflex vessels were treated with the Auqumantys. The anterior capsule was exposed and an inverted T capsulotomy was made. Fracture hematoma was encountered and evacuated. The patient was found to have a comminuted Right subcapital femoral neck fracture.  Inferior pubofemoral ligament was released subperiosteally to the lesser trochanter. I freshened the femoral neck cut  with a saw.  I removed the femoral neck fragment.  A corkscrew was placed into the head and the head was removed.  This was passed to the back table and was measured. ?  ?Acetabular exposure was achieved.  I examined the articular cartilage which was intact.  The labrum was intact. A 43 mm trial head was placed and found to have excellent fit. ?  ?I then gained femoral exposure taking care to protect the abductors and greater trochanter.  The superior capsule was incised longitudinally, staying lateral to the posterior border of the femoral neck. External rotation, extension, and adduction were applied.  A cookie cutter was used to enter the femoral canal, and then the femoral canal finder was used to confirm location.  I then sequentially broached up to a size 11.  Calcar planer was used on the femoral neck remnant.  I placed a standard offset neck and a 43 mm monopolar trial head ball. The hip was reduced.  Leg lengths were checked fluoroscopically.  The hip was dislocated and trial components were removed.  I placed the real stem followed by the real spacer and head ball.  A single reduction maneuver was performed and the hip was reduced.  Fluoroscopy was used to confirm component position and leg lengths.  At 90 degrees of external rotation and extension, the hip was stable to an anterior directed force. ?  ?The wound was copiously irrigated with Irrisept solution and normal saline using pulse lavage.  Marcaine solution was injected into the periarticular soft tissue.  The wound was closed in layers using #1 Stratafix for the fascia, 2-0 Vicryl for the subcutaneous fat, 2-0 Monocryl for the deep dermal  layer, and staples + Dermabond for the skin.  Once the glue was fully dried, an Aquacell Ag dressing was applied.  The patient was then awakened from anesthesia and transported to the recovery room in stable condition.  Sponge, needle, and instrument counts were correct at the end of the case x2.  The patient  tolerated the procedure well and there were no known complications. ? ?Please note that a surgical assistant was a medical necessity for this procedure to perform it in a safe and expeditious manner. Assistant was necessary to provide appropriate retraction of vital neurovascular structures, to prevent femoral fracture, and to allow for anatomic placement of the prosthesis. ?

## 2022-01-10 NOTE — Anesthesia Procedure Notes (Signed)
Spinal ? ?Patient location during procedure: OR ?End time: 01/10/2022 5:33 PM ?Reason for block: surgical anesthesia ?Staffing ?Performed: anesthesiologist  ?Anesthesiologist: Annye Asa, MD ?Preanesthetic Checklist ?Completed: patient identified, IV checked, site marked, risks and benefits discussed, surgical consent, monitors and equipment checked, pre-op evaluation and timeout performed ?Spinal Block ?Patient position: sitting ?Prep: DuraPrep and site prepped and draped ?Patient monitoring: blood pressure, continuous pulse ox, cardiac monitor and heart rate ?Approach: midline ?Location: L3-4 ?Injection technique: single-shot ?Needle ?Needle type: Pencan and Introducer  ?Needle gauge: 24 G ?Needle length: 9 cm ?Assessment ?Events: CSF return ?Additional Notes ?Pt identified in Operating room.  Monitors applied. Working IV access confirmed. Sterile prep, drape lumbar spine.  1% lido local L 3,4.  #24ga Pencan into clear CSF L 3,4.  '12mg'$  0.75% Bupivacaine with dextrose injected with asp CSF beginning and end of injection.  Patient asymptomatic, VSS, no heme aspirated, tolerated well.  Jenita Seashore, MD ?  ? ? ? ?

## 2022-01-10 NOTE — Progress Notes (Signed)
SLP Cancellation Note ? ?Patient Details ?Name: Karen Pearson ?MRN: [id number] ?DOB: [birth year] ? ? ?Cancelled treatment:       Reason Eval/Treat Not Completed: Medical issues which prohibited therapy ? ?Unable to complete BSE at this time, as pt is currently NPO in preparation for surgery later today. Will continue efforts. ? ?Sheyli Horwitz B. Juno Bozard, MSP, CCC-SLP ?Speech Language Pathologist ?Office: [phone] ? ?Shonna Chock ?01/10/2022, 12:46 PM ?

## 2022-01-10 NOTE — Progress Notes (Signed)
Initial Nutrition Assessment ? ?DOCUMENTATION CODES:  ? ?Not applicable ? ?INTERVENTION:  ?- Once diet advances, recommend pt to resume regular diet to provide widest availability of foods for adequate PO intake ? ?- Continue Ensure Enlive po BID, each supplement provides 350 kcal and 20 grams of protein. ? ?- MVI with minerals daily ? ?NUTRITION DIAGNOSIS:  ? ?Increased nutrient needs related to post-op healing, hip fracture as evidenced by estimated needs. ? ?GOAL:  ? ?Patient will meet greater than or equal to 90% of their needs ? ?MONITOR:  ? ?PO intake, Supplement acceptance, Diet advancement, Weight trends, Labs, Skin ? ?REASON FOR ASSESSMENT:  ? ?Consult ?Hip fracture protocol ? ?ASSESSMENT:  ? ?Pt admitted from home with a R hip fracture after a fall. PMH significant for hypothyroidism, dementia, CHF, HLD, HOH, legally blind, HTN, and basal cell cancer. ? ?Pending R hip hemiarthroplasty. Unable to obtain detailed history as she has dementia. No family present at bedside. ? ?Spoke with RN about continuing Ensure once diet resumes and recommendation of regular diet.  ? ?Per review of chart, there is a limited documentation of weight history. Current weight noted to be 57.1 kg. Will continue to monitor throughout admission. ? ?Pt noted to have moderate fat depletions and severe muscle depletions which could be attributed to advanced age. Suspect a degree of malnutrition may be present, however unable to confirm at this time given limited nutrition and weight history. Will continue to reassess as appropriate. ? ?Medication: IV abx ? ?Labs: calcium 8.8 ? ?NUTRITION - FOCUSED PHYSICAL EXAM: ? ?Flowsheet Row Most Recent Value  ?Orbital Region Moderate depletion  ?Upper Arm Region Moderate depletion  ?Thoracic and Lumbar Region Mild depletion  ?Buccal Region Moderate depletion  ?Temple Region Moderate depletion  ?Clavicle Bone Region Mild depletion  ?Clavicle and Acromion Bone Region Mild depletion  ?Scapular Bone  Region Mild depletion  ?Dorsal Hand Unable to assess  [in hand mits]  ?Patellar Region Severe depletion  ?Anterior Thigh Region Severe depletion  ?Posterior Calf Region Severe depletion  ?Edema (RD Assessment) None  ?Hair Reviewed  ?Eyes Other (Comment)  [red around inner lids]  ?Mouth Reviewed  ?Skin Other (Comment)  [yellow scabbing around L temple]  ?Nails Unable to assess  [in hand mits]  ? ?  ? ?Diet Order:   ?Diet Order   ? ?       ?  Diet NPO time specified Except for: Sips with Meds  Diet effective now       ?  ? ?  ?  ? ?  ? ? ?EDUCATION NEEDS:  ? ?No education needs have been identified at this time ? ?Skin:  Skin Assessment: Skin Integrity Issues: ?Skin Integrity Issues:: Other (Comment) ?Other: skin tear Larm, bilateral leg, R elbow ? ?Last BM:  3/20 ? ?Height:  ? ?Ht Readings from Last 1 Encounters:  ?01/10/22 '5\' 6"'$  (1.676 m)  ? ? ?Weight:  ? ?Wt Readings from Last 1 Encounters:  ?01/10/22 57.1 kg  ? ?BMI:  Body mass index is 20.32 kg/m?. ? ?Estimated Nutritional Needs:  ? ?Kcal:  1500-1700 ? ?Protein:  75-90g ? ?Fluid:  >/=1.5L ? ?Clayborne Dana, RDN, LDN ?Clinical Nutrition ?

## 2022-01-10 NOTE — Progress Notes (Addendum)
?PROGRESS NOTE ?  ?Karen Pearson  BOF:[id number]    DOB: 08-28-1925    DOA: 01/09/2022 ? ?PCP: Cari Caraway, MD  ? ?I have briefly reviewed patients previous medical records in [facility]. ? ?Chief Complaint  ?Patient presents with  ? Fall  ? Hip Injury  ? ? ?Hospital Course:  ?88 year old married female, married now for 78 years, medical history significant for moderate dementia, hard of hearing, legally blind, frequent falls, hypertension, hyperlipidemia, hypothyroidism, chronic diastolic CHF, presented to the ED following an unwitnessed fall at home on 01/09/2022, acute right hip pain and inability to weight-bear.  At baseline, ambulates at home with assistance from spouse, holding onto his arm.  Does not use a cane or a walker.  While spouse was in the laundry room, he heard patient fall, witnessed her lying on the right side and was unable to get up because of right hip pain.  Admitted for displaced right femoral neck fracture.  Orthopedics consulted and plan right hip hemiarthroplasty 3/21. ? ?Assessment & Plan:  ?Assessment and Plan: ?* Closed displaced fracture of right femoral neck ([place]) ?Sustained post unwitnessed mechanical fall at home 3/20 ?History of recurrent falls ?Orthopedics consultation appreciated and plan right hip hemiarthroplasty 3/21 PM. ?Anesthesia consulted and plan nerve block. ?Based on data available and reviewed, at least at moderate risk for perioperative CV events related to very advanced age and frail physical health.  However patient may proceed with indicated surgery without any further cardiac work-up.  Spouse has been made aware of risks and agrees. ?Multimodality pain control.  Minimize opioids and sedatives ?Delirium precautions. ?Will need SNF for STR post op. ? ?Essential hypertension ?Mildly uncontrolled. ?As per pharmacy review, patient not on antihypertensives/amlodipine at home which has been discontinued sometime last year. ?Consider as needed IV  hydralazine. ? ?Hyperlipidemia ?As per pharmacy medication review, patient not on atorvastatin which was discontinued sometime last year. ? ?Dementia with behavioral disturbance ?Moderate dementia if not worse. ?Legally blind ?Hard of hearing ?Delirium precautions ?Consider palliative care consultation for goals of care discussions. ? ?Chronic diastolic CHF (congestive heart failure) ([place]) ?Clinically compensated. ?TTE 04/22/2016: LVEF 77-82%, grade 1 diastolic dysfunction, no aortic stenosis. ?Telemetry personally reviewed: Sinus rhythm with frequent PACs.  Occasional mild tachycardia in the 120s with artifacts. ?No indication to repeat 2D echo at this time, canceled order. ?TSH 3.857. ? ?Hypothyroidism ?As per pharmacy review, patient's Synthroid was discontinued few months ago. ?TSH 3.857. ? ?Frequent falls ?TOC consulted for SNF for STR ?Chest x-ray, right knee x-ray, CT head and C-spine without acute findings. ? ?Normocytic anemia ?Hemoglobin has dropped from 12-11.7.  This may be her baseline. ?Follow CBC in a.m. ? ?Hyperglycemia ?Check A1c. ?Monitor CBGs and consider SSI if consistently greater than 180. ? ?Body mass index is 20.32 kg/m?[name] Kitchen ?Nutritional Status ?  ?  ?  ? ?DVT prophylaxis: SCDs Start: 01/09/22 2339   ?  Code Status: DNR:  ?Family Communication: Discussed in detail with patient's spouse at bedside, updated care and answered all questions.  Also discussed with anesthetist and patient's RN at bedside. ?Disposition:  ?Status is: Inpatient ?Remains inpatient appropriate because: Need for right hip surgery and postop care. ?  ? ?Consultants:   ?Orthopedics ? ?Procedures:   ?None ? ?Antimicrobials:   ?None ? ? ?Subjective:  ?Spouse and patient's RN at bedside.  Patient is alert and mostly noncommunicative.  Reported to the RN that she does not need anything at this time.  Per spouse, moving right lower  extremity somewhat painful.  Denies history of heart attack, other heart disease or lung disease.   Reports that she had history of "childhood murmur".  No report of chest pain or dyspnea.  Rest of history as noted above.  Per RN, overnight was pulling at things and bilateral mittens were placed. ? ?Objective:  ? ?Vitals:  ? 01/10/22 0030 01/10/22 0502 01/10/22 0757 01/10/22 0935  ?BP:  (!) 166/93  (!) 142/90  ?Pulse:  (!) 109  (!) 105  ?Resp:  18  (!) 22  ?Temp:  97.6 ?F (36.4 ?C)  98.8 ?F (37.1 ?C)  ?TempSrc:  Oral  Axillary  ?SpO2:  94%  92%  ?Weight: 57.4 kg  57.1 kg   ?Height: '5\' 6"'$  (1.676 m)     ? ? ?General exam: Elderly female, moderately built, frail lying comfortably supine in bed without distress. ?Respiratory system: Clear to auscultation. Respiratory effort normal. ?Cardiovascular system: S1 & S2 heard, RRR. No JVD, murmurs, rubs, gallops or clicks. No pedal edema.  Telemetry personally reviewed: Sinus rhythm with some PACs.  Occasional tachycardia but tremor artifact makes it difficult to appreciate type. ?Gastrointestinal system: Abdomen is nondistended, soft and nontender. No organomegaly or masses felt. Normal bowel sounds heard. ?Central nervous system: Alert but not oriented.  Does not follow instructions.  Hard of hearing and legally blind. No focal neurological deficits. ?Extremities: Symmetric normal-appearing power of bilateral upper extremity and left lower extremity.  Right lower extremity shortened and externally rotated.  Neurovascular bundle appears intact. ?Skin: No rashes, lesions or ulcers ?Psychiatry: Judgement and insight impaired at baseline. Mood & affect flat. ? ?Data Reviewed:   ?I have personally reviewed following labs and imaging studies ? ?CBC: ?Recent Labs  ?Lab 01/09/22 ?1836 01/10/22 ?0034  ?WBC 10.1 10.3  ?NEUTROABS 9.0*  --   ?HGB 12.0 11.7*  ?HCT 36.1 35.3*  ?MCV 93.3 91.9  ?PLT 247 258  ? ?Basic Metabolic Panel: ?Recent Labs  ?Lab 01/09/22 ?1836 01/09/22 ?2130 01/10/22 ?0034  ?NA 140  --  136  ?K 4.0  --  3.7  ?CL 106  --  104  ?CO2 25  --  18*  ?GLUCOSE 146*  --   157*  ?BUN 20  --  19  ?CREATININE 1.07*  --  0.92  ?CALCIUM 8.8*  --  8.8*  ?MG  --  2.1  --   ?PHOS  --  2.8  --   ? ?Liver Function Tests: ?Recent Labs  ?Lab 01/09/22 ?2130 01/10/22 ?0034  ?AST 22  --   ?ALT 13  --   ?ALKPHOS 73  --   ?BILITOT 0.9  --   ?PROT 7.3  --   ?ALBUMIN 4.0 3.8  ? ?CBG: ?No results for input(s): GLUCAP in the last 168 hours. ?Microbiology Studies:  ? ?Recent Results (from the past 240 hour(s))  ?Resp Panel by RT-PCR (Flu A&B, Covid) Nasopharyngeal Swab     Status: None  ? Collection Time: 01/09/22  9:21 PM  ? Specimen: Nasopharyngeal Swab; Nasopharyngeal(NP) swabs in vial transport medium  ?Result Value Ref Range Status  ? SARS Coronavirus 2 by RT PCR NEGATIVE NEGATIVE Final  ?  Comment: (NOTE) ?SARS-CoV-2 target nucleic acids are NOT DETECTED. ? ?The SARS-CoV-2 RNA is generally detectable in upper respiratory ?specimens during the acute phase of infection. The lowest ?concentration of SARS-CoV-2 viral copies this assay can detect is ?138 copies/mL. A negative result does not preclude SARS-Cov-2 ?infection and should not be used as the sole basis  for treatment or ?other patient management decisions. A negative result may occur with  ?improper specimen collection/handling, submission of specimen other ?than nasopharyngeal swab, presence of viral mutation(s) within the ?areas targeted by this assay, and inadequate number of viral ?copies(<138 copies/mL). A negative result must be combined with ?clinical observations, patient history, and epidemiological ?information. The expected result is Negative. ? ?Fact Sheet for Patients:  ?EntrepreneurPulse.com.au ? ?Fact Sheet for Healthcare Providers:  ?IncredibleEmployment.be ? ?This test is no t yet approved or cleared by the [place] FDA and  ?has been authorized for detection and/or diagnosis of SARS-CoV-2 by ?FDA under an Emergency Use Authorization (EUA). This EUA will remain  ?in effect (meaning this  test can be used) for the duration of the ?COVID-19 declaration under Section 564(b)(1) of the Act, 21 ?U.S.C.section 360bbb-3(b)(1), unless the authorization is terminated  ?or revoked sooner.  ? ? ?  ? Influenza A by PCR NEG

## 2022-01-10 NOTE — Assessment & Plan Note (Signed)
Sustained post unwitnessed mechanical fall at home 3/20 ?History of recurrent falls ?Orthopedics consultation appreciated and plan right hip hemiarthroplasty 3/21 PM. ?Anesthesia consulted and plan nerve block. ?Based on data available and reviewed, at least at moderate risk for perioperative CV events related to very advanced age and frail physical health.  However patient may proceed with indicated surgery without any further cardiac work-up.  Spouse has been made aware of risks and agrees. ?Multimodality pain control.  Minimize opioids and sedatives ?Delirium precautions. ?Will need SNF for STR post op. ?

## 2022-01-10 NOTE — Assessment & Plan Note (Addendum)
As per pharmacy review, patient's Synthroid was discontinued few months ago. ?TSH 3.857. ?

## 2022-01-10 NOTE — Anesthesia Pain Management Evaluation Note (Signed)
?  Anesthesia Pain Consult Note ? ?Patient: Karen Pearson, 88 y.o., female ? ?Consult Requested by: Modena Jansky, MD ? ?Reason for Consult: R hip fracture ? ?Level of Consciousness: lethargic  ? ?Pain: moderate  ? ?Last Vitals:  ?Vitals:  ? 01/10/22 0013 01/10/22 0502  ?BP:  (!) 166/93  ?Pulse: 94 (!) 109  ?Resp:  18  ?Temp:  36.4 ?C  ?SpO2:  94%  ? ? ?Plan: Peripheral nerve block for pain control ? ?Risks of wet tap, epidural hematoma and spinal cord injury explained to:  ? ?Consent:Risks of procedure as well as the alternatives and risks of each were explained to the (patient/caregiver).  Consent for procedure obtained.  ? ?Advance Directive:Patient named surrogate decision maker / provided an advance care plan. ? ?Allergies  ?Allergen Reactions  ?? Acetaminophen Other (See Comments)  ?  made her hyperactive  ? ? ?Physical exam: ?PULM clear to auscultation  ?CARDIO Heart sounds are normal.  Regular rate and rhythm without murmur, gallop or rub.  ?OTHER   ? ?I have reviewed the patient's medications listed below. ?? amLODipine  5 mg Oral Daily  ?? atorvastatin  10 mg Oral Daily  ?? chlorhexidine  60 mL Topical Once  ?? feeding supplement  237 mL Oral BID BM  ?? levothyroxine  75 mcg Oral Q0600  ?? povidone-iodine  2 application. Topical Once  ? ??  ceFAZolin (ANCEF) IV    ?? lactated ringers    ?? methocarbamol (ROBAXIN) IV    ?? tranexamic acid    ? ?acetaminophen, gabapentin, HYDROcodone-acetaminophen, methocarbamol **OR** methocarbamol (ROBAXIN) IV, morphine injection ? ?Past Medical History:  ?Diagnosis Date  ?? Cancer [facility])   ?? High cholesterol   ?? Hypertension   ?? Melanoma ([place])   ?? Syncope 04/22/2016  ?? Thyroid disease   ? ?Past Surgical History:  ?Procedure Laterality Date  ?? ABDOMINAL HYSTERECTOMY    ?? EYE MUSCLE SURGERY    ? ? reports that she has never smoked. She has never used smokeless tobacco. She reports that she does not drink alcohol and does not use drugs.  ? ? ?Barnet Glasgow ?01/10/2022 ? ? ? ? ?

## 2022-01-10 NOTE — Assessment & Plan Note (Signed)
Hemoglobin has dropped from 12-11.7.  This may be her baseline. ?Follow CBC in a.m. ?

## 2022-01-10 NOTE — Anesthesia Postprocedure Evaluation (Signed)
Anesthesia Post Note ? ?Patient: Karen Pearson ? ?Procedure(s) Performed: HEMI HIP ARTHROPLASTY ANTERIOR APPROACH (Right: Hip) ? ?  ? ?Patient location during evaluation: PACU ?Anesthesia Type: Spinal ?Level of consciousness: awake and alert and patient cooperative ?Pain management: pain level controlled ?Vital Signs Assessment: post-procedure vital signs reviewed and stable ?Respiratory status: spontaneous breathing, nonlabored ventilation, respiratory function stable and patient connected to nasal cannula oxygen ?Cardiovascular status: blood pressure returned to baseline and stable ?Postop Assessment: no apparent nausea or vomiting and spinal receding ?Anesthetic complications: no ? ? ?No notable events documented. ? ?Last Vitals:  ?Vitals:  ? 01/10/22 2011 01/10/22 2035  ?BP: (!) 145/76 (!) 144/76  ?Pulse:  (!) 108  ?Resp:  20  ?Temp:  36.5 ?C  ?SpO2:  98%  ?  ?Last Pain:  ?Vitals:  ? 01/10/22 2035  ?TempSrc: Oral  ?PainSc:   ? ? ?  ?  ?  ?  ?  ?  ? ?Jamilet Ambroise,E. Nereyda Bowler ? ? ? ? ?

## 2022-01-10 NOTE — Assessment & Plan Note (Addendum)
Mildly uncontrolled. ?As per pharmacy review, patient not on antihypertensives/amlodipine at home which has been discontinued sometime last year. ?Consider as needed IV hydralazine. ?

## 2022-01-10 NOTE — Anesthesia Preprocedure Evaluation (Addendum)
Anesthesia Evaluation  ?Patient identified by MRN, date of birth, ID band ?Patient awake and Patient confused ? ? ? ?Reviewed: ?Allergy & Precautions, NPO status , Patient's Chart, lab work & pertinent test results, Unable to perform ROS - Chart review only ? ?History of Anesthesia Complications ?Negative for: history of anesthetic complications ? ?Airway ?Mallampati: II ? ?TM Distance: >3 FB ?Neck ROM: Full ? ? ? Dental ? ?(+) Poor Dentition, Missing, Loose, Caps, Chipped, Dental Advisory Given ?  ?Pulmonary ?neg pulmonary ROS,  ?  ?breath sounds clear to auscultation ? ? ? ? ? ? Cardiovascular ?hypertension, Pt. on medications ?(-) angina ?Rhythm:Regular Rate:Normal ? ?'17 ECHO: mild LVH. Systolic function was vigorous. EF 65-70%. Wall motion was normal; no regional wall motion abnormalities. grade 1 DD, no significant valvular abnormalities ?  ?Neuro/Psych ?Dementia negative neurological ROS ?   ? GI/Hepatic ?negative GI ROS, Neg liver ROS,   ?Endo/Other  ?Hypothyroidism  ? Renal/GU ?negative Renal ROS  ? ?  ?Musculoskeletal ? ?(+) Arthritis ,  ? Abdominal ?  ?Peds ? Hematology ? ?(+) Blood dyscrasia (Hb 11.7, plt 258k), anemia ,   ?Anesthesia Other Findings ? ? Reproductive/Obstetrics ? ?  ? ? ? ? ? ? ? ? ? ? ? ? ? ?  ?  ? ? ? ? ? ? ? ? ?Anesthesia Physical ?Anesthesia Plan ? ?ASA: 3 ? ?Anesthesia Plan: Spinal  ? ?Post-op Pain Management: Minimal or no pain anticipated  ? ?Induction:  ? ?PONV Risk Score and Plan: 2 and Ondansetron and Treatment may vary due to age or medical condition ? ?Airway Management Planned: Natural Airway and Simple Face Mask ? ?Additional Equipment: None ? ?Intra-op Plan:  ? ?Post-operative Plan:  ? ?Informed Consent: I have reviewed the patients History and Physical, chart, labs and discussed the procedure including the risks, benefits and alternatives for the proposed anesthesia with the patient or authorized representative who has indicated his/her  understanding and acceptance.  ? ?Patient has DNR.  ?Discussed DNR with patient, Discussed DNR with power of attorney and Suspend DNR. ?  ?Dental advisory given and Consent reviewed with POA ? ?Plan Discussed with: CRNA and Surgeon ? ?Anesthesia Plan Comments: (Consent by Dr. Valma Cava at time of PENG block for analgesia earlier today ?Discussed with patient's daughter and granddaughter by telephone)  ? ? ? ? ? ?Anesthesia Quick Evaluation ? ?

## 2022-01-10 NOTE — Assessment & Plan Note (Addendum)
Clinically compensated. ?TTE 04/22/2016: LVEF 60-73%, grade 1 diastolic dysfunction, no aortic stenosis. ?Telemetry personally reviewed: Sinus rhythm with frequent PACs.  Occasional mild tachycardia in the 120s with artifacts. ?No indication to repeat 2D echo at this time, canceled order. ?TSH 3.857. ?

## 2022-01-10 NOTE — Assessment & Plan Note (Addendum)
As per pharmacy medication review, patient not on atorvastatin which was discontinued sometime last year. ?

## 2022-01-10 NOTE — Assessment & Plan Note (Signed)
Check A1c. ?Monitor CBGs and consider SSI if consistently greater than 180. ?

## 2022-01-10 NOTE — Discharge Instructions (Signed)
? ?Dr. Virgilene Stryker ?Joint Replacement Specialist ?[facility] ?[address] ?[place], [place] [address] ?[phone] ? ? ?TOTAL HIP REPLACEMENT POSTOPERATIVE DIRECTIONS ? ? ? ?Hip Rehabilitation, Guidelines Following Surgery  ? ?WEIGHT BEARING ?Weight bearing as tolerated with assist device (walker, cane, etc) as directed, use it as long as suggested by your surgeon or therapist, typically at least 4-6 weeks. ? ?The results of a hip operation are greatly improved after range of motion and muscle strengthening exercises. Follow all safety measures which are given to protect your hip. If any of these exercises cause increased pain or swelling in your joint, decrease the amount until you are comfortable again. Then slowly increase the exercises. Call your caregiver if you have problems or questions.  ? ?HOME CARE INSTRUCTIONS  ?Most of the following instructions are designed to prevent the dislocation of your new hip.  ?Remove items at home which could result in a fall. This includes throw rugs or furniture in walking pathways.  ?Continue medications as instructed at time of discharge. ?You may have some home medications which will be placed on hold until you complete the course of blood thinner medication. ?You may start showering once you are discharged home. Do not remove your dressing. ?Do not put on socks or shoes without following the instructions of your caregivers.   ?Sit on chairs with arms. Use the chair arms to help push yourself up when arising.  ?Arrange for the use of a toilet seat elevator so you are not sitting low.  ?Walk with walker as instructed.  ?You may resume a sexual relationship in one month or when given the OK by your caregiver.  ?Use walker as long as suggested by your caregivers.  ?You may put full weight on your legs and walk as much as is comfortable. ?Avoid periods of inactivity such as sitting longer than an hour when not asleep. This helps prevent blood  clots.  ?You may return to work once you are cleared by your surgeon.  ?Do not drive a car for 6 weeks or until released by your surgeon.  ?Do not drive while taking narcotics.  ?Wear elastic stockings for two weeks following surgery during the day but you may remove then at night.  ?Make sure you keep all of your appointments after your operation with all of your doctors and caregivers. You should call the office at the above phone number and make an appointment for approximately two weeks after the date of your surgery. ?Please pick up a stool softener and laxative for home use as long as you are requiring pain medications. ?ICE to the affected hip every three hours for 30 minutes at a time and then as needed for pain and swelling. Continue to use ice on the hip for pain and swelling from surgery. You may notice swelling that will progress down to the foot and ankle.  This is normal after surgery.  Elevate the leg when you are not up walking on it.   ?It is important for you to complete the blood thinner medication as prescribed by your doctor. ?Continue to use the breathing machine which will help keep your temperature down.  It is common for your temperature to cycle up and down following surgery, especially at night when you are not up moving around and exerting yourself.  The breathing machine keeps your lungs expanded and your temperature down. ? ?RANGE OF MOTION AND STRENGTHENING EXERCISES  ?These exercises are designed to help you   keep full movement of your hip joint. Follow your caregiver's or physical therapist's instructions. Perform all exercises about fifteen times, three times per day or as directed. Exercise both hips, even if you have had only one joint replacement. These exercises can be done on a training (exercise) mat, on the floor, on a table or on a bed. Use whatever works the best and is most comfortable for you. Use music or television while you are exercising so that the exercises are a  pleasant break in your day. This will make your life better with the exercises acting as a break in routine you can look forward to.  ?Lying on your back, slowly slide your foot toward your buttocks, raising your knee up off the floor. Then slowly slide your foot back down until your leg is straight again.  ?Lying on your back spread your legs as far apart as you can without causing discomfort.  ?Lying on your side, raise your upper leg and foot straight up from the floor as far as is comfortable. Slowly lower the leg and repeat.  ?Lying on your back, tighten up the muscle in the front of your thigh (quadriceps muscles). You can do this by keeping your leg straight and trying to raise your heel off the floor. This helps strengthen the largest muscle supporting your knee.  ?Lying on your back, tighten up the muscles of your buttocks both with the legs straight and with the knee bent at a comfortable angle while keeping your heel on the floor.  ? ?SKILLED REHAB INSTRUCTIONS: ?If the patient is transferred to a skilled rehab facility following release from the hospital, a list of the current medications will be sent to the facility for the patient to continue.  When discharged from the skilled rehab facility, please have the facility set up the patient's Home Health Physical Therapy prior to being released. Also, the skilled facility will be responsible for providing the patient with their medications at time of release from the facility to include their pain medication and their blood thinner medication. If the patient is still at the rehab facility at time of the two week follow up appointment, the skilled rehab facility will also need to assist the patient in arranging follow up appointment in our office and any transportation needs. ? ?POST-OPERATIVE OPIOID TAPER INSTRUCTIONS: ?It is important to wean off of your opioid medication as soon as possible. If you do not need pain medication after your surgery it is ok  to stop day one. ?Opioids include: ?Codeine, Hydrocodone(Norco, Vicodin), Oxycodone(Percocet, oxycontin) and hydromorphone amongst others.  ?Long term and even short term use of opiods can cause: ?Increased pain response ?Dependence ?Constipation ?Depression ?Respiratory depression ?And more.  ?Withdrawal symptoms can include ?Flu like symptoms ?Nausea, vomiting ?And more ?Techniques to manage these symptoms ?Hydrate well ?Eat regular healthy meals ?Stay active ?Use relaxation techniques(deep breathing, meditating, yoga) ?Do Not substitute Alcohol to help with tapering ?If you have been on opioids for less than two weeks and do not have pain than it is ok to stop all together.  ?Plan to wean off of opioids ?This plan should start within one week post op of your joint replacement. ?Maintain the same interval or time between taking each dose and first decrease the dose.  ?Cut the total daily intake of opioids by one tablet each day ?Next start to increase the time between doses. ?The last dose that should be eliminated is the evening dose.  ? ? ?MAKE   SURE YOU:  ?Understand these instructions.  ?Will watch your condition.  ?Will get help right away if you are not doing well or get worse. ? ?Pick up stool softner and laxative for home use following surgery while on pain medications. ?Do not remove your dressing. ?The dressing is waterproof--it is OK to take showers. ?Continue to use ice for pain and swelling after surgery. ?Do not use any lotions or creams on the incision until instructed by your surgeon. ?Total Hip Protocol. ? ?

## 2022-01-10 NOTE — Transfer of Care (Signed)
Immediate Anesthesia Transfer of Care Note ? ?Patient: Karen Pearson ? ?Procedure(s) Performed: HEMI HIP ARTHROPLASTY ANTERIOR APPROACH (Right: Hip) ? ?Patient Location: PACU ? ?Anesthesia Type:Spinal ? ?Level of Consciousness: drowsy and patient cooperative ? ?Airway & Oxygen Therapy: Patient Spontanous Breathing and Patient connected to face mask oxygen ? ?Post-op Assessment: Report given to RN and Post -op Vital signs reviewed and stable ? ?Post vital signs: Reviewed and stable ? ?Last Vitals:  ?Vitals Value Taken Time  ?BP 124/79 01/10/22 1900  ?Temp    ?Pulse 94 01/10/22 1901  ?Resp 17 01/10/22 1901  ?SpO2 100 % 01/10/22 1901  ?Vitals shown include unvalidated device data. ? ?Last Pain:  ?Vitals:  ? 01/10/22 1630  ?TempSrc: Oral  ?PainSc: 0-No pain  ?   ? ?  ? ?Complications: No notable events documented. ?

## 2022-01-10 NOTE — Assessment & Plan Note (Signed)
Moderate dementia if not worse. ?Legally blind ?Hard of hearing ?Delirium precautions ?Consider palliative care consultation for goals of care discussions. ?

## 2022-01-10 NOTE — Progress Notes (Addendum)
Assisted Dr. Valma Cava with right, ultrasound guided peng (pericapsular nerve group) block. Side rails up, monitors on throughout procedure. See vital signs in flow sheet. Tolerated Procedure well. ? ?

## 2022-01-10 NOTE — Consult Note (Signed)
WOC Nurse Consult Note: ?Reason for Consult: Wounds on back. None noted when assessed pre-surgery on 01/10/22. Skin tears to arms are addressed by standing skin care order set. ?Wound type: N/A ?Pressure Injury POA: N/A ?Measurement:N/A ?Wound bed:N/A ?Drainage (amount, consistency, odor) N/A ?Periwound:N/A ?Dressing procedure/placement/frequency: Guidance provided for Nursing for turning and repositioning, floatation of heels, placement of a sacral prophylactic foam dressing and for the provision of incontinence care using our house skin care products. ? ?[place] nursing team will not follow, but will remain available to this patient, the nursing and medical teams.  Please re-consult if needed. ?Thanks, ?Maudie Flakes, MSN, RN, [place], [place], CWON-AP, [place]  ?Pager# [phone]  ? ? ? ?  ?

## 2022-01-10 NOTE — Anesthesia Procedure Notes (Signed)
Anesthesia Regional Block: Peng block  ?Laterality: Right and Lower ? ?Prep: chloraprep     ?  ?Needles:  ?Injection technique: Single-shot ? ?Needle Type: Echogenic Needle   ? ? ?Needle Length: 9cm  ?Needle Gauge: 21  ? ? ? ?Additional Needles: ? ? ?Procedures: Doppler guided,,,, ultrasound used (permanent image in chart),,    ?Narrative:  ?Start time: 01/10/2022 10:20 AM ?End time: 01/10/2022 10:34 AM ? ?Performed by: Personally  ?Anesthesiologist: Barnet Glasgow, MD ? ?Additional Notes: ?Pt tolerated pocedure well ? ? ? ? ?

## 2022-01-10 NOTE — Hospital Course (Signed)
88 year old married female, married now for 109 years, medical history significant for moderate dementia, hard of hearing, legally blind, frequent falls, hypertension, hyperlipidemia, hypothyroidism, chronic diastolic CHF, presented to the ED following an unwitnessed fall at home on 01/09/2022, acute right hip pain and inability to weight-bear.  At baseline, ambulates at home with assistance from spouse, holding onto his arm.  Does not use a cane or a walker.  While spouse was in the laundry room, he heard patient fall, witnessed her lying on the right side and was unable to get up because of right hip pain.  Admitted for displaced right femoral neck fracture.  Orthopedics consulted and plan right hip hemiarthroplasty 3/21. ?

## 2022-01-10 NOTE — Assessment & Plan Note (Addendum)
TOC consulted for SNF for STR ?Chest x-ray, right knee x-ray, CT head and C-spine without acute findings. ?

## 2022-01-11 DIAGNOSIS — S72001A Fracture of unspecified part of neck of right femur, initial encounter for closed fracture: Secondary | ICD-10-CM | POA: Diagnosis not present

## 2022-01-11 LAB — BASIC METABOLIC PANEL
Anion gap: 11 (ref 5–15)
Anion gap: 9 (ref 5–15)
BUN: 23 mg/dL (ref 8–23)
BUN: 23 mg/dL (ref 8–23)
CO2: 21 mmol/L — ABNORMAL LOW (ref 22–32)
CO2: 23 mmol/L (ref 22–32)
Calcium: 8.5 mg/dL — ABNORMAL LOW (ref 8.9–10.3)
Calcium: 8.4 mg/dL — ABNORMAL LOW (ref 8.9–10.3)
Chloride: 103 mmol/L (ref 98–111)
Chloride: 102 mmol/L (ref 98–111)
Creatinine, Ser: 0.9 mg/dL (ref 0.44–1.00)
Creatinine, Ser: 0.98 mg/dL (ref 0.44–1.00)
GFR, Estimated: 59 mL/min — ABNORMAL LOW (ref >60)
GFR, Estimated: 53 mL/min — ABNORMAL LOW (ref >60)
Glucose, Bld: 128 mg/dL — ABNORMAL HIGH (ref 70–99)
Glucose, Bld: 103 mg/dL — ABNORMAL HIGH (ref 70–99)
Potassium: 4.2 mmol/L (ref 3.5–5.1)
Potassium: 4.1 mmol/L (ref 3.5–5.1)
Sodium: 135 mmol/L (ref 135–145)
Sodium: 134 mmol/L — ABNORMAL LOW (ref 135–145)

## 2022-01-11 LAB — CBC
HCT: 33.2 % — ABNORMAL LOW (ref 36.0–46.0)
Hemoglobin: 10.7 g/dL — ABNORMAL LOW (ref 12.0–15.0)
MCH: 29.9 pg (ref 26.0–34.0)
MCHC: 32.2 g/dL (ref 30.0–36.0)
MCV: 92.7 fL (ref 80.0–100.0)
Platelets: 233 10*3/uL (ref 150–400)
RBC: 3.58 MIL/uL — ABNORMAL LOW (ref 3.87–5.11)
RDW: 12.7 % (ref 11.5–15.5)
WBC: 10.3 10*3/uL (ref 4.0–10.5)
nRBC: 0 % (ref 0.0–0.2)

## 2022-01-11 MED ORDER — CEFAZOLIN SODIUM-DEXTROSE 2-4 GM/100ML-% IV SOLN
2.0000 g | Freq: Four times a day (QID) | INTRAVENOUS | Status: AC
  Administered 2022-01-11 (×2): 2 g via INTRAVENOUS
  Filled 2022-01-11 (×2): qty 100

## 2022-01-11 MED ORDER — HYDROCODONE-ACETAMINOPHEN 7.5-325 MG PO TABS
1.0000 | ORAL_TABLET | ORAL | Status: DC | PRN
  Administered 2022-01-12: 1 via ORAL
  Filled 2022-01-11: qty 1

## 2022-01-11 MED ORDER — DIPHENHYDRAMINE HCL 12.5 MG/5ML PO ELIX: 12.5000 mg | ORAL_SOLUTION | ORAL | Status: DC | PRN

## 2022-01-11 MED ORDER — METOCLOPRAMIDE HCL 5 MG PO TABS: 5.0000 mg | ORAL_TABLET | Freq: Three times a day (TID) | ORAL | Status: DC | PRN

## 2022-01-11 MED ORDER — ALUM & MAG HYDROXIDE-SIMETH 200-200-20 MG/5ML PO SUSP: 30.0000 mL | ORAL | Status: DC | PRN

## 2022-01-11 MED ORDER — HYDROCODONE-ACETAMINOPHEN 5-325 MG PO TABS
1.0000 | ORAL_TABLET | ORAL | Status: DC | PRN
  Administered 2022-01-11 – 2022-01-12 (×3): 1 via ORAL
  Filled 2022-01-11 (×3): qty 1

## 2022-01-11 MED ORDER — MENTHOL 3 MG MT LOZG: 1.0000 | LOZENGE | OROMUCOSAL | Status: DC | PRN

## 2022-01-11 MED ORDER — MORPHINE SULFATE (PF) 2 MG/ML IV SOLN: 0.5000 mg | INTRAVENOUS | Status: DC | PRN

## 2022-01-11 MED ORDER — ACETAMINOPHEN 325 MG PO TABS: 325.0000 mg | ORAL_TABLET | Freq: Four times a day (QID) | ORAL | Status: DC | PRN

## 2022-01-11 MED ORDER — SENNA 8.6 MG PO TABS
1.0000 | ORAL_TABLET | Freq: Two times a day (BID) | ORAL | Status: DC
  Administered 2022-01-11 – 2022-01-12 (×3): 8.6 mg via ORAL
  Filled 2022-01-11 (×3): qty 1

## 2022-01-11 MED ORDER — METOCLOPRAMIDE HCL 5 MG/ML IJ SOLN: 5.0000 mg | Freq: Three times a day (TID) | INTRAMUSCULAR | Status: DC | PRN

## 2022-01-11 MED ORDER — ONDANSETRON HCL 4 MG/2ML IJ SOLN: 4.0000 mg | Freq: Four times a day (QID) | INTRAMUSCULAR | Status: DC | PRN

## 2022-01-11 MED ORDER — ASPIRIN 81 MG PO CHEW
81.0000 mg | CHEWABLE_TABLET | Freq: Two times a day (BID) | ORAL | Status: DC
  Administered 2022-01-11 – 2022-01-12 (×3): 81 mg via ORAL
  Filled 2022-01-11 (×3): qty 1

## 2022-01-11 MED ORDER — ONDANSETRON HCL 4 MG PO TABS: 4.0000 mg | ORAL_TABLET | Freq: Four times a day (QID) | ORAL | Status: DC | PRN

## 2022-01-11 MED ORDER — DOCUSATE SODIUM 100 MG PO CAPS
100.0000 mg | ORAL_CAPSULE | Freq: Two times a day (BID) | ORAL | Status: DC
  Administered 2022-01-11 – 2022-01-12 (×3): 100 mg via ORAL
  Filled 2022-01-11 (×3): qty 1

## 2022-01-11 MED ORDER — SODIUM CHLORIDE 0.9 % IV SOLN: INTRAVENOUS | Status: DC

## 2022-01-11 MED ORDER — METHOCARBAMOL 1000 MG/10ML IJ SOLN
500.0000 mg | Freq: Four times a day (QID) | INTRAVENOUS | Status: DC | PRN
  Filled 2022-01-11: qty 5

## 2022-01-11 MED ORDER — CHLORHEXIDINE GLUCONATE CLOTH 2 % EX PADS: 6.0000 | MEDICATED_PAD | Freq: Every day | CUTANEOUS | Status: DC

## 2022-01-11 MED ORDER — METHOCARBAMOL 500 MG PO TABS: 500.0000 mg | ORAL_TABLET | Freq: Four times a day (QID) | ORAL | Status: DC | PRN

## 2022-01-11 MED ORDER — PHENOL 1.4 % MT LIQD: 1.0000 | OROMUCOSAL | Status: DC | PRN

## 2022-01-11 MED ORDER — POLYETHYLENE GLYCOL 3350 17 G PO PACK: 17.0000 g | PACK | Freq: Every day | ORAL | Status: DC | PRN

## 2022-01-11 NOTE — Evaluation (Signed)
Occupational Therapy Evaluation Patient Details Name: Karen Pearson MRN: [id number] DOB: [birth year] Today's Date: 01/11/2022   History of Present Illness Patient is a 88 year old female admitted after unwitnessed fall at home resulting in R hip fracture. S/p R hip hemiathroplasty 3/21. PMH: Melanoma, HTN, legally blind   Clinical Impression   Patient lives at home with spouse and needs assistance at baseline 2* dementia and blindness. Spouse reports he provides hand held assist at baseline for ambulation/transfers and assists her with eating, dressing, bathing, and using bathroom. Spouse reports patient is sometimes incontinent at home. Currently patient needing total A +2 for bed mobility due to initial sleepiness. Patient becomes more alert with mobility. Patient +2 max A to pivot first to bedside commode then to recliner. Patient becomes a little anxious with standing, grabbing onto therapist's arm. In seated position patient was able to perform peri care after voiding. Acute OT to follow to maximize patient overall strength, balance and endurance to minimize caregiver burden with functional transfers and self care tasks.       Recommendations for follow up therapy are one component of a multi-disciplinary discharge planning process, led by the attending physician.  Recommendations may be updated based on patient status, additional functional criteria and insurance authorization.   Follow Up Recommendations  Skilled nursing-short term rehab (<3 hours/day)    Assistance Recommended at Discharge Frequent or constant Supervision/Assistance  Patient can return home with the following Two people to help with walking and/or transfers;A lot of help with bathing/dressing/bathroom;Assistance with cooking/housework;Assistance with feeding;Direct supervision/assist for medications management;Direct supervision/assist for financial management;Assist for transportation;Help with stairs or ramp for  entrance    Functional Status Assessment  Patient has had a recent decline in their functional status and/or demonstrates limited ability to make significant improvements in function in a reasonable and predictable amount of time  Equipment Recommendations  BSC/3in1       Precautions / Restrictions Precautions Precautions: Fall Restrictions Weight Bearing Restrictions: No Other Position/Activity Restrictions: WBAT      Mobility Bed Mobility Overal bed mobility: Needs Assistance Bed Mobility: Supine to Sit     Supine to sit: Total assist, +2 for physical assistance, +2 for safety/equipment, HOB elevated     General bed mobility comments: Patient initially sleepy and needing total A +2 to initiate bed mobility        Balance Overall balance assessment: Needs assistance, History of Falls Sitting-balance support: Feet supported Sitting balance-Leahy Scale: Fair Sitting balance - Comments: Once feet planted on floor patient able to maintain sitting balance   Standing balance support: Bilateral upper extremity supported Standing balance-Leahy Scale: Zero Standing balance comment: max +2                           ADL either performed or assessed with clinical judgement   ADL Overall ADL's : Needs assistance/impaired Eating/Feeding: Total assistance Eating/Feeding Details (indicate cue type and reason): Spouse feeds patient at baseline 2* blindness Grooming: Set up;Supervision/safety;Wash/dry face;Wash/dry Programmer, applications Details (indicate cue type and reason): Verbal cues needed Upper Body Bathing: Minimal assistance;Sitting   Lower Body Bathing: Total assistance;Sitting/lateral leans   Upper Body Dressing : Moderate assistance;Sitting   Lower Body Dressing: Total assistance;Sitting/lateral leans;Sit to/from stand Lower Body Dressing Details (indicate cue type and reason): Spouse assists at bedline Toilet Transfer: +2 for physical assistance;+2 for  safety/equipment;Maximal assistance;BSC/3in1 Toilet Transfer Details (indicate cue type and reason): Side by side +2 assist to take few  steps first to bedside commode then to toilet Toileting- Clothing Manipulation and Hygiene: Maximal assistance;Sitting/lateral lean;Sit to/from stand Toileting - Clothing Manipulation Details (indicate cue type and reason): Patient was able to perform peri care after voiding in seated position, would need assist with clothing/BM hygiene in standing due to heavy reliance on external support     Functional mobility during ADLs: Maximal assistance;+2 for safety/equipment;+2 for physical assistance General ADL Comments: Patient needing assist at baseline with self care however now increased assist for transfers from baseline     Vision Baseline Vision/History: 2 Legally blind Ability to See in Adequate Light: 4 Severely impaired              Pertinent Vitals/Pain Pain Assessment Pain Assessment: Faces Faces Pain Scale: Hurts a little bit Pain Location: hip Pain Descriptors / Indicators: Discomfort Pain Intervention(s): Monitored during session, RN gave pain meds during session     Hand Dominance Left   Extremity/Trunk Assessment Upper Extremity Assessment Upper Extremity Assessment: Generalized weakness   Lower Extremity Assessment Lower Extremity Assessment: Defer to PT evaluation       Communication Communication Communication: HOH   Cognition Arousal/Alertness: Awake/alert Behavior During Therapy: WFL for tasks assessed/performed, Anxious (anxious with mobility) Overall Cognitive Status: History of cognitive impairments - at baseline                                 General Comments: Unaware of what happened/having surgery                Home Living Family/patient expects to be discharged to:: Private residence Living Arrangements: Spouse/significant other Available Help at Discharge: Family;Available 24  hours/day Type of Home: House Home Access: Stairs to enter [employer] of Steps: 1 step porch then 1 step   Home Layout: One level     Bathroom Shower/Tub: Sponge bathes at baseline   Bathroom Toilet: Handicapped height     Home Equipment: None          Prior Functioning/Environment Prior Level of Function : Needs assist             Mobility Comments: Spouse provides hand held assistance for all ambulation ADLs Comments: Spouse assists with dressing, bathing, toileting. Patient is blind        OT Problem List: Decreased activity tolerance;Impaired balance (sitting and/or standing)      OT Treatment/Interventions: Self-care/ADL training;Balance training;Patient/family education;Therapeutic activities;Therapeutic exercise    OT Goals(Current goals can be found in the care plan section) Acute Rehab OT Goals Patient Stated Goal: Use potty chair OT Goal Formulation: With patient/family Time For Goal Achievement: 01/25/22 Potential to Achieve Goals: Fair  OT Frequency: Min 2X/week    Co-evaluation PT/OT/SLP Co-Evaluation/Treatment: Yes Reason for Co-Treatment: For patient/therapist safety;To address functional/ADL transfers PT goals addressed during session: Mobility/safety with mobility OT goals addressed during session: ADL's and self-care      AM-PAC OT "6 Clicks" Daily Activity     Outcome Measure Help from another person eating meals?: Total Help from another person taking care of personal grooming?: A Little Help from another person toileting, which includes using toliet, bedpan, or urinal?: A Lot Help from another person bathing (including washing, rinsing, drying)?: A Lot Help from another person to put on and taking off regular upper body clothing?: A Little Help from another person to put on and taking off regular lower body clothing?: Total 6 Click Score: 12   End  of Session Equipment Utilized During Treatment: Gait belt Nurse  Communication: Mobility status  Activity Tolerance: Patient tolerated treatment well Patient left: in chair;with call bell/phone within reach;with chair alarm set;with family/visitor present  OT Visit Diagnosis: History of falling (Z91.81);Other abnormalities of gait and mobility (R26.89)                Time: 1914-7829 OT Time Calculation (min): 26 min Charges:  OT General Charges $OT Visit: 1 Visit OT Evaluation $OT Eval Low Complexity: 1 Low  Marlyce Huge OT OT pager: [phone]  Carmelia Roller 01/11/2022, 1:10 PM

## 2022-01-11 NOTE — Progress Notes (Signed)
?PROGRESS NOTE ? ? ? ?Karen Pearson  TFT:[id number] DOB: 08/22/1925 DOA: 01/09/2022 ?PCP: Cari Caraway, MD  ? ?  ?Brief Narrative:  ?Karen Pearson is a 88 year old female, married now for 79 years, medical history significant for moderate dementia, hard of hearing, legally blind, frequent falls, hypertension, hyperlipidemia, hypothyroidism, chronic diastolic CHF, presented to the ED following an unwitnessed fall at home on 01/09/2022, acute right hip pain and inability to weight-bear.  At baseline, ambulates at home with assistance from spouse, holding onto his arm.  Does not use a cane or a walker.  While spouse was in the laundry room, he heard patient fall, witnessed her lying on the right side and was unable to get up because of right hip pain.  Admitted for displaced right femoral neck fracture.  Orthopedics consulted and patient underwent right hip hemiarthroplasty 3/21. ? ?New events last 24 hours / Subjective: ?Patient resting in bed with spouse at bedside.  Spouse states that patient seems to be at her baseline.  Patient has hard of hearing, legally blind and has moderate dementia.  She denies any worsening pain nausea or vomiting.  Awaiting breakfast this morning. ? ?Assessment & Plan: ?  ?Principal Problem: ?  Closed displaced fracture of right femoral neck ([place]) ?Active Problems: ?  Essential hypertension ?  Hyperlipidemia ?  Dementia with behavioral disturbance ?  Chronic diastolic CHF (congestive heart failure) ([place]) ?  Hypothyroidism ?  Frequent falls ?  Debility ?  Hyperglycemia ?  Normocytic anemia ? ? ?Closed displaced fracture of the right femoral neck status post mechanical fall at home ?-Status post right hip hemiarthroplasty 3/21 ?-PT evaluation pending, will likely need short-term rehab ?-Aspirin for DVT prophylaxis ? ?Hypertension ?-Patient no longer takes Norvasc ? ?Hyperlipidemia ?-Patient no longer takes atorvastatin ? ?Alzheimer's dementia ?-Delirium precaution ? ?Chronic diastolic heart  failure ?-Does not appear to be decompensated at this time ? ?Hypothyroidism ?-Patient no longer takes Synthroid ?-TSH 3.857 ? ?CKD stage IIIa ?-Stable ? ? ?DVT prophylaxis:  ?SCDs Start: 01/11/22 7062 ? ?Code Status: DNR ?Family Communication: Spouse at bedside ?Disposition Plan:  ?Status is: Inpatient ?Remains inpatient appropriate because: Need PT assessment ? ?Antimicrobials:  ?Anti-infectives (From admission, onward)  ? ? Start     Dose/Rate Route Frequency Ordered Stop  ? 01/11/22 0800  ceFAZolin (ANCEF) IVPB 2g/100 mL premix       ? 2 g ?200 mL/hr over 30 Minutes Intravenous Every 6 hours 01/11/22 0641 01/11/22 1959  ? 01/10/22 0600  ceFAZolin (ANCEF) IVPB 2g/100 mL premix       ? 2 g ?200 mL/hr over 30 Minutes Intravenous On call to O.R. 01/09/22 2338 01/10/22 1738  ? ?  ? ? ? ?Objective: ?Vitals:  ? 01/11/22 0038 01/11/22 0620 01/11/22 0813 01/11/22 1149  ?BP: (!) 163/83 (!) 154/95 (!) 146/75 127/71  ?Pulse: (!) 109 (!) 107 100 96  ?Resp: '20 20 18 18  '$ ?Temp: 97.8 ?F (36.6 ?C) 98.2 ?F (36.8 ?C) 97.8 ?F (36.6 ?C) 97.6 ?F (36.4 ?C)  ?TempSrc: Oral Oral Oral Oral  ?SpO2: 95% 98% 100% 93%  ?Weight:      ?Height:      ? ? ?Intake/Output Summary (Last 24 hours) at 01/11/2022 1306 ?Last data filed at 01/11/2022 1000 ?Gross per 24 hour  ?Intake 1812.99 ml  ?Output 650 ml  ?Net 1162.99 ml  ? ?Filed Weights  ? 01/09/22 1819 01/10/22 0030 01/10/22 0757  ?Weight: 56.7 kg 57.4 kg 57.1 kg  ? ? ?Examination:  ?General  exam: Appears calm and comfortable, hard of hearing ?Respiratory system: Clear to auscultation. Respiratory effort normal. No respiratory distress.  On 2 L nasal cannula O2 ?Cardiovascular system: S1 & S2 heard, irregular rhythm  ?Gastrointestinal system: Abdomen is nondistended, soft  ?Central nervous system: Alert ?Psychiatry: [facility] & affect appropriate.  ? ?Data Reviewed: I have personally reviewed following labs and imaging studies ? ?CBC: ?Recent Labs  ?Lab 01/09/22 ?1836 01/10/22 ?6812 01/11/22 ?7517   ?WBC 10.1 10.3 10.3  ?NEUTROABS 9.0*  --   --   ?HGB 12.0 11.7* 10.7*  ?HCT 36.1 35.3* 33.2*  ?MCV 93.3 91.9 92.7  ?PLT 247 258 233  ? ?Basic Metabolic Panel: ?Recent Labs  ?Lab 01/09/22 ?1836 01/09/22 ?2130 01/10/22 ?0017 01/11/22 ?4944 01/11/22 ?0732  ?NA 140  --  136 135 134*  ?K 4.0  --  3.7 4.2 4.1  ?CL 106  --  104 103 102  ?CO2 25  --  18* 21* 23  ?GLUCOSE 146*  --  157* 128* 103*  ?BUN 20  --  '19 23 23  '$ ?CREATININE 1.07*  --  0.92 0.90 0.98  ?CALCIUM 8.8*  --  8.8* 8.5* 8.4*  ?MG  --  2.1  --   --   --   ?PHOS  --  2.8  --   --   --   ? ?GFR: ?Estimated Creatinine Clearance: 31.6 mL/min (by C-G formula based on SCr of 0.98 mg/dL). ?Liver Function Tests: ?Recent Labs  ?Lab 01/09/22 ?2130 01/10/22 ?0034  ?AST 22  --   ?ALT 13  --   ?ALKPHOS 73  --   ?BILITOT 0.9  --   ?PROT 7.3  --   ?ALBUMIN 4.0 3.8  ? ?No results for input(s): LIPASE, AMYLASE in the last 168 hours. ?No results for input(s): AMMONIA in the last 168 hours. ?Coagulation Profile: ?Recent Labs  ?Lab 01/09/22 ?1836  ?INR 1.1  ? ?Cardiac Enzymes: ?Recent Labs  ?Lab 01/09/22 ?2130  ?CKTOTAL 115  ? ?BNP (last 3 results) ?No results for input(s): PROBNP in the last 8760 hours. ?HbA1C: ?No results for input(s): HGBA1C in the last 72 hours. ?CBG: ?No results for input(s): GLUCAP in the last 168 hours. ?Lipid Profile: ?No results for input(s): CHOL, HDL, LDLCALC, TRIG, CHOLHDL, LDLDIRECT in the last 72 hours. ?Thyroid Function Tests: ?Recent Labs  ?  01/10/22 ?0034  ?TSH 3.857  ? ?Anemia Panel: ?No results for input(s): VITAMINB12, FOLATE, FERRITIN, TIBC, IRON, RETICCTPCT in the last 72 hours. ?Sepsis Labs: ?No results for input(s): PROCALCITON, LATICACIDVEN in the last 168 hours. ? ?Recent Results (from the past 240 hour(s))  ?Resp Panel by RT-PCR (Flu A&B, Covid) Nasopharyngeal Swab     Status: None  ? Collection Time: 01/09/22  9:21 PM  ? Specimen: Nasopharyngeal Swab; Nasopharyngeal(NP) swabs in vial transport medium  ?Result Value Ref Range Status   ? SARS Coronavirus 2 by RT PCR NEGATIVE NEGATIVE Final  ?  Comment: (NOTE) ?SARS-CoV-2 target nucleic acids are NOT DETECTED. ? ?The SARS-CoV-2 RNA is generally detectable in upper respiratory ?specimens during the acute phase of infection. The lowest ?concentration of SARS-CoV-2 viral copies this assay can detect is ?138 copies/mL. A negative result does not preclude SARS-Cov-2 ?infection and should not be used as the sole basis for treatment or ?other patient management decisions. A negative result may occur with  ?improper specimen collection/handling, submission of specimen other ?than nasopharyngeal swab, presence of viral mutation(s) within the ?areas targeted by this assay, and inadequate number  of viral ?copies(<138 copies/mL). A negative result must be combined with ?clinical observations, patient history, and epidemiological ?information. The expected result is Negative. ? ?Fact Sheet for Patients:  ?EntrepreneurPulse.com.au ? ?Fact Sheet for Healthcare Providers:  ?IncredibleEmployment.be ? ?This test is no t yet approved or cleared by the [place] FDA and  ?has been authorized for detection and/or diagnosis of SARS-CoV-2 by ?FDA under an Emergency Use Authorization (EUA). This EUA will remain  ?in effect (meaning this test can be used) for the duration of the ?COVID-19 declaration under Section 564(b)(1) of the Act, 21 ?U.S.C.section 360bbb-3(b)(1), unless the authorization is terminated  ?or revoked sooner.  ? ? ?  ? Influenza A by PCR NEGATIVE NEGATIVE Final  ? Influenza B by PCR NEGATIVE NEGATIVE Final  ?  Comment: (NOTE) ?The Xpert Xpress SARS-CoV-2/FLU/RSV plus assay is intended as an aid ?in the diagnosis of influenza from Nasopharyngeal swab specimens and ?should not be used as a sole basis for treatment. Nasal washings and ?aspirates are unacceptable for Xpert Xpress SARS-CoV-2/FLU/RSV ?testing. ? ?Fact Sheet for  Patients: ?EntrepreneurPulse.com.au ? ?Fact Sheet for Healthcare Providers: ?IncredibleEmployment.be ? ?This test is not yet approved or cleared by the [place] and ?has been authorized for detection and

## 2022-01-11 NOTE — Progress Notes (Signed)
? ? ?  Subjective: ? ?Patient reports pain as moderate in the upper thigh with movement.  Denies N/V/CP/SOB. Pain is controlled at this time. Per her nurse she was able to ambulate to the bathroom with the rolling walker using shuffle steps, and is not complaining of too much pain. ? ?Objective:  ? ?VITALS:   ?Vitals:  ? 01/11/22 0620 01/11/22 0813 01/11/22 1149 01/11/22 1732  ?BP: (!) 154/95 (!) 146/75 127/71 (!) 141/68  ?Pulse: (!) 107 100 96 100  ?Resp: '20 18 18 18  '$ ?Temp: 98.2 ?F (36.8 ?C) 97.8 ?F (36.6 ?C) 97.6 ?F (36.4 ?C) 97.9 ?F (36.6 ?C)  ?TempSrc: Oral Oral Oral Oral  ?SpO2: 98% 100% 93% 96%  ?Weight:      ?Height:      ? ? ?Patient is alert and lying in bed comfortably. Patient is legally blind and has dementia but is able to answer questions and follow commands better today. Pain with palpation to the anterior portion of the thigh. Painless log rolling of the hip.  ? ?ABD soft and non-tender.  ?Neurovascularly intact ?Distal pedal pulses 2+ bilaterally.  ?Sensation intact distally. ?Dorsiflexion, planter flexion, and EHL intact. ?Incision: Dressing C/D/I. ?Calves non tender bilaterally, patient wearing SCDs.  ? ? ? ?Lab Results  ?Component Value Date  ? WBC 10.3 01/11/2022  ? HGB 10.7 (L) 01/11/2022  ? HCT 33.2 (L) 01/11/2022  ? MCV 92.7 01/11/2022  ? PLT 233 01/11/2022  ? ?BMET ?   ?Component Value Date/Time  ? NA 134 (L) 01/11/2022 0732  ? K 4.1 01/11/2022 0732  ? CL 102 01/11/2022 0732  ? CO2 23 01/11/2022 0732  ? GLUCOSE 103 (H) 01/11/2022 0732  ? BUN 23 01/11/2022 0732  ? CREATININE 0.98 01/11/2022 0732  ? CALCIUM 8.4 (L) 01/11/2022 0732  ? GFRNONAA 53 (L) 01/11/2022 0732  ? ? ? ?Assessment/Plan: ?1 Day Post-Op  ?s/p right hip hemiarthroplasty 01/10/22 ? ?Principal Problem: ?  Closed displaced fracture of right femoral neck ([place]) ?Active Problems: ?  Hypothyroidism ?  Debility ?  Essential hypertension ?  Hyperlipidemia ?  Dementia with behavioral disturbance ?  Chronic diastolic CHF (congestive heart  failure) ([place]) ?  Frequent falls ?  Hyperglycemia ?  Normocytic anemia ? ?Plan ?WBAT with walker ?DVT ppx: Aspirin, SCDs, TEDS ?PO pain control: Norco 5-'325mg'$  ?PT/OT: Patient working with PT and OT. She tolerated WB and took shuffle steps with rolling walker.  ?Advance diet ?Up with therapy ?Dispo: Likely discharge to SNF for rehab per PT/OT/SW notes. Patients husband and daughter are n board wit this plan per SW. Patient will follow-up with Dr. Lyla Glassing in 2 weeks for wound check and x-rays.  ? ? ? ? ? ?Marciano Sequin Felipe Cabell ?01/11/2022, 5:42 PM ? ? ?Rod Can, MD ?[phone] ?[place] [facility] now [employer]  [facility] [facility] ?[address], [place], [place] [address] ?Phone: [phone] ?[email or web address] ?Facebook  Engineer, structural  ?  ?  ?

## 2022-01-11 NOTE — Evaluation (Signed)
Clinical/Bedside Swallow Evaluation ?Patient Details  ?Name: Karen Pearson ?MRN: [id number] ?Date of Birth: 09/17/1925 ? ?Today's Date: 01/11/2022 ?Time: SLP Start Time (ACUTE ONLY): [id number] SLP Stop Time (ACUTE ONLY): 1002 ?SLP Time Calculation (min) (ACUTE ONLY): 13 min ? ?Past Medical History:  ?Past Medical History:  ?Diagnosis Date  ? Cancer [facility])   ? High cholesterol   ? Hypertension   ? Melanoma ([place])   ? Syncope 04/22/2016  ? Thyroid disease   ? ?Past Surgical History:  ?Past Surgical History:  ?Procedure Laterality Date  ? ABDOMINAL HYSTERECTOMY    ? EYE MUSCLE SURGERY    ? ?HPI:  ?Karen Pearson is a 88 y.o. female who complains of right hip pain after unwitnessed ground level fall at home and found to have a right hip fracture. PMH: cancer, high cholesterol, HTN, thyroid disease. GERD  ?  ?Assessment / Plan / Recommendation  ?Clinical Impression ? Pt's husband reported pt has GER and history of regurgitation that was significantly mitigated by several week dose of meds per MD and has not had much difficulty since. She has significant vision and hearing loss and needs some assist with feeding. She is missing majority of dentition but able to timely and effectively masticast and propel an egg and cheese English muffin. There were no signs of aspiration with sips thin via straw and no indications of esophageal involvement. Discussed with husband to sit in upright position for meals and keep upright 45 min after. Continue regular/thin and no further ST needed. ?SLP Visit Diagnosis: Dysphagia, unspecified (R13.10) ?   ?Aspiration Risk ? Mild aspiration risk  ?  ?Diet Recommendation Regular;Thin liquid  ? ?Liquid Administration via: Cup;Straw ?Medication Administration: Whole meds with puree ?Supervision: Staff to assist with self feeding ?Compensations: Slow rate;Small sips/bites ?Postural Changes: Remain upright for at least 30 minutes after po intake;Seated upright at 90 degrees  ?  ?Other  Recommendations Oral Care  Recommendations: Oral care BID   ? ?Recommendations for follow up therapy are one component of a multi-disciplinary discharge planning process, led by the attending physician.  Recommendations may be updated based on patient status, additional functional criteria and insurance authorization. ? ?Follow up Recommendations No SLP follow up  ? ? ?  ?Assistance Recommended at Discharge None  ?Functional Status Assessment    ?Frequency and Duration    ?  ?  ?   ? ?Prognosis    ? ?  ? ?Swallow Study   ?General Date of Onset: 01/09/22 ?HPI: [name] is a 88 y.o. female who complains of right hip pain after unwitnessed ground level fall at home and found to have a right hip fracture. PMH: cancer, high cholesterol, HTN, thyroid disease. GERD ?Type of Study: Bedside Swallow Evaluation ?Previous Swallow Assessment:  (no) ?Diet Prior to this Study: Regular;Thin liquids ?Temperature Spikes Noted: No ?Respiratory Status: Nasal cannula ?History of Recent Intubation: Yes ?Length of Intubations (days):  (surgery only) ?Date extubated: 01/10/22 ?Behavior/Cognition: Alert;Cooperative;Pleasant mood ?Oral Cavity Assessment: Within Functional Limits ?Oral Care Completed by SLP: No ?Oral Cavity - Dentition: Poor condition;Missing dentition ?Vision: Impaired for self-feeding ?Self-Feeding Abilities: Needs assist ?Patient Positioning: Upright in bed ?Baseline Vocal Quality: Normal ?Volitional Cough: Strong ?Volitional Swallow: Able to elicit  ?  ?Oral/Motor/Sensory Function Overall Oral Motor/Sensory Function: Within functional limits   ?Ice Chips Ice chips: Not tested   ?Thin Liquid Thin Liquid: Within functional limits ?Presentation: Straw  ?  ?Nectar Thick Nectar Thick Liquid: Not tested   ?Honey Thick Honey Thick Liquid:  Not tested   ?Puree Puree: Not tested   ?Solid ? ? ?  Solid: Within functional limits  ? ?  ? ?Houston Siren ?01/11/2022,10:20 AM ? ? ? ?

## 2022-01-11 NOTE — TOC Initial Note (Addendum)
Transition of Care (TOC) - Initial/Assessment Note  ? ? ?Patient Details  ?Name: Karen Pearson ?MRN: [id number] ?Date of Birth: [birth year] ? ?Transition of Care (TOC) CM/SW Contact:    ?[place], LCSW ?Phone Number: ?01/11/2022, 2:26 PM ? ?Clinical Narrative:  ? ?CSW met with pt, pt spouse, and family friend, Electrical engineer. Pt is not fully oriented so CSW primarily speaks to pt spouse. Explained PT recommendation for SNF and explained SNF process and medicare coverage. Pt spouse explains pt and him live at home together and he has been primarily assisting her at home. Spouse is hesitant about SNF and frequently asks his wife if she is agreeable though pt does not answer and is confused with who her spouse is. CSW explained to spouse that because pt is not fully oriented, a decision would be needed from him. Spouse agrees to initial SNF workup and consents to CSW discussing with their daughter. Spouse does express interest in [facility] as it is close to their home. Fl2 completed and bed requests sent in hub.                ? ?CSW called pt's daughter. She is agreeable to SNF and believes it would be too much for spouse to manage pt at home. She is familiar with SNF process and appreciated the update.  ? ?Expected Discharge Plan: [place] ?Barriers to Discharge: Continued Medical Work up ? ? ?Patient Goals and CMS Choice ?  ?  ?  ? ?Expected Discharge Plan and Services ?Expected Discharge Plan: [place] ?  ?  ?  ?Living arrangements for the past 2 months: [place] ?                ?  ?  ?  ?  ?  ?  ?  ?  ?  ?  ? ?Prior Living Arrangements/Services ?Living arrangements for the past 2 months: [place] ?Lives with:: Spouse ?  ?       ?  ?  ?  ?  ? ?Activities of Daily Living ?Home Assistive Devices/Equipment: Eyeglasses ?ADL Screening (condition at time of admission) ?Patient's cognitive ability adequate to safely complete daily activities?: No ?Is the patient deaf or  have difficulty hearing?: Yes (per previous hx) ?Does the patient have difficulty seeing, even when wearing glasses/contacts?: Yes ?Does the patient have difficulty concentrating, remembering, or making decisions?: Yes ?Patient able to express need for assistance with ADLs?: No ?Does the patient have difficulty dressing or bathing?: Yes ?Independently performs ADLs?: No ?Communication: Independent ?Dressing (OT): Needs assistance ?Is this a change from baseline?: Pre-admission baseline ?Grooming: Needs assistance ?Is this a change from baseline?: Pre-admission baseline ?Feeding: Needs assistance ?Is this a change from baseline?: Pre-admission baseline ?Bathing: Needs assistance ?Is this a change from baseline?: Pre-admission baseline ?Toileting: Needs assistance ?Is this a change from baseline?: Pre-admission baseline ?In/Out Bed: Needs assistance ?Is this a change from baseline?: Pre-admission baseline ?Walks in Home: Needs assistance ?Is this a change from baseline?: Pre-admission baseline ?Does the patient have difficulty walking or climbing stairs?: Yes ?Weakness of Legs: Right ?Weakness of Arms/Hands: None ? ?Permission Sought/Granted ?  ?  ?   ?   ?   ?   ? ?Emotional Assessment ?Appearance:: Appears stated age ?Attitude/Demeanor/Rapport: Lethargic ?Affect (typically observed): Flat ?Orientation: : Oriented to Self ?Alcohol / Substance Use: Not Applicable ?Psych Involvement: No (comment) ? ?Admission diagnosis:  Closed right hip fracture ([place]) [S72.001A] ?Closed fracture  of neck of right femur, initial encounter ([place]) [S72.001A] ?Patient Active Problem List  ? Diagnosis Date Noted  ? Frequent falls 01/10/2022  ? Hyperglycemia 01/10/2022  ? Normocytic anemia 01/10/2022  ? Fracture of femoral neck, right ([place]) 01/09/2022  ? Debility 01/09/2022  ? Closed displaced fracture of right femoral neck ([place]) 01/09/2022  ? Essential hypertension 01/09/2022  ? Hyperlipidemia 01/09/2022  ? Dementia with behavioral  disturbance 01/09/2022  ? Chronic diastolic CHF (congestive heart failure) ([place]) 01/09/2022  ? Hypothyroidism 04/23/2016  ? ?PCP:  Cari Caraway, MD ?Pharmacy:   ?[id number] [id number]?Phone: [phone]: [phone]? ? ? ? ?Social Determinants of Health (SDOH) Interventions ?  ? ?Readmission Risk Interventions ?   ? View : No data to display.  ?  ?  ?  ? ? ? ?

## 2022-01-11 NOTE — NC FL2 (Addendum)
?[place] MEDICAID FL2 LEVEL OF CARE SCREENING TOOL  ?  ? ?IDENTIFICATION  ?Patient Name: ?Karen Pearson Birthdate: 08/10/1925 Sex: female Admission Date (Current Location): ?01/09/2022  ?[place] and [place] Number: ? [place] ?  Facility and Address:  ?The . [facility] [place] [address] [place], [place] [address] ?     Provider Number: ?[id number]  ?Attending Physician Name and Address:  ?Dessa Phi, DO ? Relative Name and Phone Number:  ?Kahlen, Boyde (Spouse)   [phone] [facility] Phone) ?   ?Current Level of Care: ?Hospital Recommended Level of Care: ?[place] Prior Approval Number: ?  ? ?Date Approved/Denied: ?  PASRR Number: ?[id number] A ? ?Discharge Plan: ?SNF ?  ? ?Current Diagnoses: ?Patient Active Problem List  ? Diagnosis Date Noted  ? Frequent falls 01/10/2022  ? Hyperglycemia 01/10/2022  ? Normocytic anemia 01/10/2022  ? Fracture of femoral neck, right ([place]) 01/09/2022  ? Debility 01/09/2022  ? Closed displaced fracture of right femoral neck ([place]) 01/09/2022  ? Essential hypertension 01/09/2022  ? Hyperlipidemia 01/09/2022  ? Dementia with behavioral disturbance 01/09/2022  ? Chronic diastolic CHF (congestive heart failure) ([place]) 01/09/2022  ? Hypothyroidism 04/23/2016  ? ? ?Orientation RESPIRATION BLADDER Height & Weight   ?  ?Self ? Normal Incontinent, External catheter Weight: 125 lb 14.1 oz (57.1 kg) ?Height:  '5\' 6"'$  (167.6 cm)  ?BEHAVIORAL SYMPTOMS/MOOD NEUROLOGICAL BOWEL NUTRITION STATUS  ?    Continent Diet (See d/c summary)  ?AMBULATORY STATUS COMMUNICATION OF NEEDS Skin Incision right hip; skin tears on left arm, right elbow, bilateral legs   ?Extensive Assist Verbally   ?  ?  ?  ?    ?     ?     ? ? ?Personal Care Assistance Level of Assistance  ?Bathing, Feeding, Dressing Bathing Assistance: Maximum assistance ?Feeding assistance: Independent ?Dressing Assistance: Maximum assistance ?   ? ?Functional Limitations Info  ?Hearing, Sight, Speech Sight Info:  Impaired ?Hearing Info: Impaired ?Speech Info: Adequate  ? ? ?SPECIAL CARE FACTORS FREQUENCY  ?OT (By licensed OT), PT (By licensed PT)   ?  ?PT Frequency: 5x/week ?OT Frequency: 5x/week ?  ?  ?  ?   ? ? ?Contractures Contractures Info: Not present  ? ? ?Additional Factors Info  ?Code Status, Allergies Code Status Info: DNR ?Allergies Info: Acetaminophen ?  ?  ?  ?   ? ?Current Medications (01/11/2022):  This is the current hospital active medication list ?Current Facility-Administered Medications  ?Medication Dose Route Frequency Provider Last Rate Last Admin  ? acetaminophen (TYLENOL) tablet 325-650 mg  325-650 mg Oral Q6H PRN Swinteck, Aaron Edelman, MD      ? alum & mag hydroxide-simeth (MAALOX/MYLANTA) 200-200-20 MG/5ML suspension 30 mL  30 mL Oral Q4H PRN Swinteck, Aaron Edelman, MD      ? aspirin chewable tablet 81 mg  81 mg Oral BID Rod Can, MD   81 mg at 01/11/22 0913  ? docusate sodium (COLACE) capsule 100 mg  100 mg Oral BID Rod Can, MD   100 mg at 01/11/22 0913  ? feeding supplement (ENSURE ENLIVE / ENSURE PLUS) liquid 237 mL  237 mL Oral BID BM Swinteck, Aaron Edelman, MD   237 mL at 01/11/22 1343  ? hydrALAZINE (APRESOLINE) injection 5 mg  5 mg Intravenous Q8H PRN Swinteck, Aaron Edelman, MD      ? HYDROcodone-acetaminophen (NORCO) 7.5-325 MG per tablet 1-2 tablet  1-2 tablet Oral Q4H PRN Rod Can, MD      ? HYDROcodone-acetaminophen (NORCO/VICODIN) 5-325 MG  per tablet 1-2 tablet  1-2 tablet Oral Q4H PRN Rod Can, MD   1 tablet at 01/11/22 6144  ? hydrOXYzine (ATARAX) tablet 25 mg  25 mg Oral QHS Rod Can, MD   25 mg at 01/10/22 2220  ? menthol-cetylpyridinium (CEPACOL) lozenge 3 mg  1 lozenge Oral PRN Swinteck, Aaron Edelman, MD      ? Or  ? phenol (CHLORASEPTIC) mouth spray 1 spray  1 spray Mouth/Throat PRN Swinteck, Aaron Edelman, MD      ? methocarbamol (ROBAXIN) tablet 500 mg  500 mg Oral Q6H PRN Swinteck, Aaron Edelman, MD      ? Or  ? methocarbamol (ROBAXIN) 500 mg in dextrose 5 % 50 mL IVPB  500 mg Intravenous Q6H  PRN Swinteck, Aaron Edelman, MD      ? metoCLOPramide (REGLAN) tablet 5-10 mg  5-10 mg Oral Q8H PRN Swinteck, Aaron Edelman, MD      ? Or  ? metoCLOPramide (REGLAN) injection 5-10 mg  5-10 mg Intravenous Q8H PRN Swinteck, Aaron Edelman, MD      ? morphine (PF) 2 MG/ML injection 0.5-1 mg  0.5-1 mg Intravenous Q2H PRN Swinteck, Aaron Edelman, MD      ? ondansetron (ZOFRAN) tablet 4 mg  4 mg Oral Q6H PRN Swinteck, Aaron Edelman, MD      ? Or  ? ondansetron (ZOFRAN) injection 4 mg  4 mg Intravenous Q6H PRN Swinteck, Aaron Edelman, MD      ? polyethylene glycol (MIRALAX / GLYCOLAX) packet 17 g  17 g Oral Daily PRN Swinteck, Aaron Edelman, MD      ? senna (SENOKOT) tablet 8.6 mg  1 tablet Oral BID Rod Can, MD   8.6 mg at 01/11/22 0913  ? ? ? ?Discharge Medications: ?Please see discharge summary for a list of discharge medications. ? ?Relevant Imaging Results: ? ?Relevant Lab Results: ? ? ?Additional Information ?SSN [id number] ? ?[place], LCSW ? ? ? ? ?

## 2022-01-11 NOTE — Evaluation (Addendum)
Physical Therapy Evaluation ?Patient Details ?Name: Karen Pearson ?MRN: [id number] ?DOB: 09/10/1925 ?Today's Date: 01/11/2022 ? ?History of Present Illness ? Patient is a 88 year old female admitted after unwitnessed fall at home resulting in R hip fracture. S/p R hip hemiathroplasty 3/21. PMH: Melanoma, HTN, legally blind  ?Clinical Impression ? The  patient is very pleasant, legally blind. Spouse present to provide information. Patient requires 2 persons for  mobility , able to stand Patient  at [facility] with 2 max assist. Patient tolerated  WB and took shuffle steps to BS using RW, assist to move the Rw. Patient  then stood and stepped to recliner with HHA of 2  persons. Patient ambulatory with spouse always assisting with Hand hold. Patient will benefit from SNF for rehab. Patient's spouse is an exceptional caregiver with limited family support. ?Pt admitted with above diagnosis.  Pt currently with functional limitations due to the deficits listed below (see PT Problem List). Pt will benefit from skilled PT to increase their independence and safety with mobility to allow discharge to the venue listed below.   ?  ? ?Recommendations for follow up therapy are one component of a multi-disciplinary discharge planning process, led by the attending physician.  Recommendations may be updated based on patient status, additional functional criteria and insurance authorization. ? ?Follow Up Recommendations Skilled nursing-short term rehab (<3 hours/day) ? ?  ?Assistance Recommended at Discharge Frequent or constant Supervision/Assistance  ?Patient can return home with the following ? Two people to help with walking and/or transfers;A lot of help with bathing/dressing/bathroom;Assistance with cooking/housework;Assist for transportation;Help with stairs or ramp for entrance;Assistance with feeding ? ?  ?Equipment Recommendations Rolling walker (2 wheels)  ?Recommendations for Other Services ?    ?  ?Functional Status Assessment  Patient has had a recent decline in their functional status and demonstrates the ability to make significant improvements in function in a reasonable and predictable amount of time.  ? ?  ?Precautions / Restrictions Precautions ?Precautions: Fall ?Precaution Comments: legally blind ?Restrictions ?Weight Bearing Restrictions: Yes ?RLE Weight Bearing: Weight bearing as tolerated ?Other Position/Activity Restrictions: WBAT  ? ?  ? ?Mobility ? Bed Mobility ?  ?  ?  ?  ?  ?  ?  ?  ?  ? ?Transfers ?Overall transfer level: Needs assistance ?Equipment used: Rolling walker (2 wheels) ?Transfers: Sit to/from Stand, Bed to chair/wheelchair/BSC ?Sit to Stand: +2 physical assistance, +2 safety/equipment, Max assist ?  ?Step pivot transfers: Mod assist, +2 safety/equipment, +2 physical assistance ?  ?  ?  ?General transfer comment: multimodal cues  for hand placement to stand , pivot with Rw from bed to [place] then to recliner ?  ? ?Ambulation/Gait ?  ?  ?  ?  ?  ?  ?  ?  ? ?Stairs ?  ?  ?  ?  ?  ? ?Wheelchair Mobility ?  ? ?Modified Rankin (Stroke Patients Only) ?  ? ?  ? ?Balance Overall balance assessment: Needs assistance, History of Falls ?Sitting-balance support: Feet supported ?Sitting balance-Leahy Scale: Fair ?Sitting balance - Comments: Once feet planted on floor patient able to maintain sitting balance ?  ?Standing balance support: Bilateral upper extremity supported, Reliant on assistive device for balance ?Standing balance-Leahy Scale: Zero ?Standing balance comment: max +2 ?  ?  ?  ?  ?  ?  ?  ?  ?  ?  ?  ?   ? ? ? ?Pertinent Vitals/Pain Pain Assessment ?Faces Pain Scale: Hurts a  little bit ?Pain Location: hip ?Pain Descriptors / Indicators: Discomfort ?Pain Intervention(s): Premedicated before session, Monitored during session, Ice applied  ? ? ?Home Living Family/patient expects to be discharged to:: Private residence ?Living Arrangements: Spouse/significant other ?Available Help at Discharge: Family;Available 24  hours/day ?Type of Home: House ?Home Access: Stairs to enter ?  ?Entrance Stairs-Number of Steps: 1 step porch then 1 step ?  ?Home Layout: One level ?Home Equipment: None ?   ?  ?Prior Function Prior Level of Function : Needs assist ?  ?  ?  ?  ?  ?  ?Mobility Comments: Spouse provides hand held assistance for all ambulation ?ADLs Comments: Spouse assists with dressing, bathing, toileting. and feeding, Patient is blind ?  ? ? ?Hand Dominance  ? Dominant Hand: Left ? ?  ?Extremity/Trunk Assessment  ? Upper Extremity Assessment ?Upper Extremity Assessment: Generalized weakness ?  ? ?Lower Extremity Assessment ?Lower Extremity Assessment: RLE deficits/detail ?RLE Deficits / Details: able to bear weight to stand and puivot, tolerated hip flex to 90* seated ?  ? ?Cervical / Trunk Assessment ?Cervical / Trunk Assessment: Kyphotic  ?Communication  ? Communication: HOH  ?Cognition Arousal/Alertness: Awake/alert ?Behavior During Therapy: [facility] for tasks assessed/performed, Anxious ?Overall Cognitive Status: History of cognitive impairments - at baseline ?  ?  ?  ?  ?  ?  ?  ?  ?  ?  ?  ?  ?  ?  ?  ?  ?General Comments: Unaware of what happened/having surgery, does follow 1 step commands with increased time and tactile cues ?  ?  ? ?  ?General Comments   ? ?  ?Exercises    ? ?Assessment/Plan  ?  ?PT Assessment Patient needs continued PT services  ?PT Problem List Decreased strength;Decreased mobility;Decreased safety awareness;Decreased range of motion;Decreased knowledge of precautions;Decreased activity tolerance;Decreased cognition;Decreased balance;Decreased knowledge of use of DME;Pain ? ?   ?  ?PT Treatment Interventions DME instruction;Therapeutic activities;Cognitive remediation;Gait training;Therapeutic exercise;Patient/family education;Functional mobility training   ? ?PT Goals (Current goals can be found in the Care Plan section)  ?Acute Rehab PT Goals ?Patient Stated Goal: to be able to walk and go home ?PT Goal  Formulation: With family ?Time For Goal Achievement: 01/25/22 ?Potential to Achieve Goals: Good ? ?  ?Frequency Min 2X/week ?  ? ? ?Co-evaluation PT/OT/SLP Co-Evaluation/Treatment: Yes ?Reason for Co-Treatment: For patient/therapist safety ?PT goals addressed during session: Mobility/safety with mobility ?OT goals addressed during session: ADL's and self-care ?  ? ? ?  ?AM-PAC PT "6 Clicks" Mobility  ?Outcome Measure Help needed turning from your back to your side while in a flat bed without using bedrails?: Total ?Help needed moving from lying on your back to sitting on the side of a flat bed without using bedrails?: Total ?Help needed moving to and from a bed to a chair (including a wheelchair)?: A Lot ?Help needed standing up from a chair using your arms (e.g., wheelchair or bedside chair)?: A Lot ?Help needed to walk in hospital room?: Total ?Help needed climbing 3-5 steps with a railing? : Total ?6 Click Score: 8 ? ?  ?End of Session Equipment Utilized During Treatment: Gait belt ?Activity Tolerance: Patient tolerated treatment well ?Patient left: in chair;with call bell/phone within reach;with chair alarm set;with family/visitor present ?Nurse Communication: Mobility status ?PT Visit Diagnosis: Unsteadiness on feet (R26.81);Difficulty in walking, not elsewhere classified (R26.2);Pain ?Pain - Right/Left: Right ?Pain - part of body: Hip ?  ? ?Time: 1200-1230 ?PT Time Calculation (min) (ACUTE  ONLY): 30 min ? ? ?Charges:   PT Evaluation ?$PT Eval Low Complexity: 1 Low ?  ?  ?   ? ?Tresa Endo PT ?Acute Rehabilitation Services ?Pager [phone] ?Office [phone] ? ?  ? ? ?Claretha Cooper ?01/11/2022, 1:49 PM ? ?

## 2022-01-12 ENCOUNTER — Encounter (HOSPITAL_COMMUNITY): Payer: Self-pay | Admitting: Orthopedic Surgery

## 2022-01-12 DIAGNOSIS — S72031D Displaced midcervical fracture of right femur, subsequent encounter for closed fracture with routine healing: Secondary | ICD-10-CM | POA: Diagnosis not present

## 2022-01-12 DIAGNOSIS — F03B0 Unspecified dementia, moderate, without behavioral disturbance, psychotic disturbance, mood disturbance, and anxiety: Secondary | ICD-10-CM | POA: Diagnosis not present

## 2022-01-12 DIAGNOSIS — I509 Heart failure, unspecified: Secondary | ICD-10-CM | POA: Diagnosis not present

## 2022-01-12 DIAGNOSIS — R1314 Dysphagia, pharyngoesophageal phase: Secondary | ICD-10-CM | POA: Diagnosis not present

## 2022-01-12 DIAGNOSIS — R41841 Cognitive communication deficit: Secondary | ICD-10-CM | POA: Diagnosis not present

## 2022-01-12 DIAGNOSIS — E785 Hyperlipidemia, unspecified: Secondary | ICD-10-CM | POA: Diagnosis not present

## 2022-01-12 DIAGNOSIS — R634 Abnormal weight loss: Secondary | ICD-10-CM | POA: Diagnosis not present

## 2022-01-12 DIAGNOSIS — Z7401 Bed confinement status: Secondary | ICD-10-CM | POA: Diagnosis not present

## 2022-01-12 DIAGNOSIS — S72001S Fracture of unspecified part of neck of right femur, sequela: Secondary | ICD-10-CM | POA: Diagnosis not present

## 2022-01-12 DIAGNOSIS — C44529 Squamous cell carcinoma of skin of other part of trunk: Secondary | ICD-10-CM | POA: Diagnosis not present

## 2022-01-12 DIAGNOSIS — Z711 Person with feared health complaint in whom no diagnosis is made: Secondary | ICD-10-CM | POA: Diagnosis not present

## 2022-01-12 DIAGNOSIS — E039 Hypothyroidism, unspecified: Secondary | ICD-10-CM | POA: Diagnosis not present

## 2022-01-12 DIAGNOSIS — Z22322 Carrier or suspected carrier of Methicillin resistant Staphylococcus aureus: Secondary | ICD-10-CM | POA: Diagnosis not present

## 2022-01-12 DIAGNOSIS — F03918 Unspecified dementia, unspecified severity, with other behavioral disturbance: Secondary | ICD-10-CM | POA: Diagnosis not present

## 2022-01-12 DIAGNOSIS — L988 Other specified disorders of the skin and subcutaneous tissue: Secondary | ICD-10-CM | POA: Diagnosis not present

## 2022-01-12 DIAGNOSIS — D649 Anemia, unspecified: Secondary | ICD-10-CM | POA: Diagnosis not present

## 2022-01-12 DIAGNOSIS — Z9181 History of falling: Secondary | ICD-10-CM | POA: Diagnosis not present

## 2022-01-12 DIAGNOSIS — L989 Disorder of the skin and subcutaneous tissue, unspecified: Secondary | ICD-10-CM | POA: Diagnosis not present

## 2022-01-12 DIAGNOSIS — H548 Legal blindness, as defined in USA: Secondary | ICD-10-CM | POA: Diagnosis not present

## 2022-01-12 DIAGNOSIS — I10 Essential (primary) hypertension: Secondary | ICD-10-CM | POA: Diagnosis not present

## 2022-01-12 DIAGNOSIS — K219 Gastro-esophageal reflux disease without esophagitis: Secondary | ICD-10-CM | POA: Diagnosis not present

## 2022-01-12 DIAGNOSIS — M6281 Muscle weakness (generalized): Secondary | ICD-10-CM | POA: Diagnosis not present

## 2022-01-12 DIAGNOSIS — I110 Hypertensive heart disease with heart failure: Secondary | ICD-10-CM | POA: Diagnosis not present

## 2022-01-12 DIAGNOSIS — F5102 Adjustment insomnia: Secondary | ICD-10-CM | POA: Diagnosis not present

## 2022-01-12 DIAGNOSIS — I5032 Chronic diastolic (congestive) heart failure: Secondary | ICD-10-CM | POA: Diagnosis not present

## 2022-01-12 DIAGNOSIS — S72001A Fracture of unspecified part of neck of right femur, initial encounter for closed fracture: Secondary | ICD-10-CM | POA: Diagnosis not present

## 2022-01-12 DIAGNOSIS — R262 Difficulty in walking, not elsewhere classified: Secondary | ICD-10-CM | POA: Diagnosis not present

## 2022-01-12 DIAGNOSIS — F0390 Unspecified dementia without behavioral disturbance: Secondary | ICD-10-CM | POA: Diagnosis not present

## 2022-01-12 DIAGNOSIS — E119 Type 2 diabetes mellitus without complications: Secondary | ICD-10-CM | POA: Diagnosis not present

## 2022-01-12 DIAGNOSIS — R4182 Altered mental status, unspecified: Secondary | ICD-10-CM | POA: Diagnosis not present

## 2022-01-12 DIAGNOSIS — R2681 Unsteadiness on feet: Secondary | ICD-10-CM | POA: Diagnosis not present

## 2022-01-12 MED ORDER — HYDROCODONE-ACETAMINOPHEN 5-325 MG PO TABS: 1.0000 | ORAL_TABLET | ORAL | 0 refills | Status: AC | PRN

## 2022-01-12 MED ORDER — ASPIRIN 81 MG PO CHEW: 81.0000 mg | CHEWABLE_TABLET | Freq: Two times a day (BID) | ORAL | 0 refills | Status: AC

## 2022-01-12 NOTE — TOC Progression Note (Addendum)
Transition of Care (TOC) - Progression Note  ? ? ?Patient Details  ?Name: Karen Pearson ?MRN: [id number] ?Date of Birth: October 15, 1925 ? ?Transition of Care (TOC) CM/SW Contact  ?[place], LCSW ?Phone Number: ?01/12/2022, 10:47 AM ? ?Clinical Narrative:    ? ?CSW called pt daughter and provided bed offers. She prefers [facility] and states it is close to her fathers home. Pt spouse not in room and phone number in chart does not work. Daughter provided # for pt spouse, [phone]; this number did not work either. Bed offers with medicare star ratings left in pt room.  ? ?1200: CSW still unable to reach spouse. CSW called [employer] to do a welfare check for pt's spouse at home.  ? ?1310: [employer] officer called CSW explained he has pt spouse available by phone. CSW explained situation and bed choices. CSW explained that daughters choice was [facility] as it was highest rated and closest to his home. Spouse is agreeable to pt Dcing to [facility] today.  ? ?Expected Discharge Plan: [place] ?Barriers to Discharge: No Barriers Identified ? ?Expected Discharge Plan and Services ?Expected Discharge Plan: [place] ?  ?  ?  ?Living arrangements for the past 2 months: [place] ?                ?  ?  ?  ?  ?  ?  ?  ?  ?  ?  ? ? ?Social Determinants of Health (SDOH) Interventions ?  ? ?Readmission Risk Interventions ?   ? View : No data to display.  ?  ?  ?  ? ? ?

## 2022-01-12 NOTE — Discharge Summary (Signed)
Physician Discharge Summary  ?Karen Pearson YIR:[id number] DOB: [birth year] DOA: 01/09/2022 ? ?PCP: Cari Caraway, MD ? ?Admit date: 01/09/2022 ?Discharge date: 01/12/2022 ? ?Admitted From: Home ?Disposition:  SNF  ? ?Recommendations for Outpatient Follow-up:  ?Follow up with Dr. Lyla Glassing in 2 weeks ? ?Discharge Condition: Stable ?CODE STATUS: DNR ?Diet recommendation: Regular diet ? ?Brief/Interim Summary: ?Karen Pearson is a 88 year old female, married now for 35 years, medical history significant for moderate dementia, hard of hearing, legally blind, frequent falls, hypertension, hyperlipidemia, hypothyroidism, chronic diastolic CHF, presented to the ED following an unwitnessed fall at home on 01/09/2022, acute right hip pain and inability to weight-bear.  At baseline, ambulates at home with assistance from spouse, holding onto his arm.  Does not use a cane or a walker.  While spouse was in the laundry room, he heard patient fall, witnessed her lying on the right side and was unable to get up because of right hip pain.  Admitted for displaced right femoral neck fracture.  Orthopedics consulted and patient underwent right hip hemiarthroplasty 3/21. ? ?Discharge Diagnoses:  ?Principal Problem: ?  Closed displaced fracture of right femoral neck ([place]) ?Active Problems: ?  Essential hypertension ?  Hyperlipidemia ?  Dementia with behavioral disturbance ?  Chronic diastolic CHF (congestive heart failure) ([place]) ?  Hypothyroidism ?  Frequent falls ?  Debility ?  Hyperglycemia ?  Normocytic anemia ? ? ?Closed displaced fracture of the right femoral neck status post mechanical fall at home ?-Status post right hip hemiarthroplasty 3/21 ?-PT evaluation recommending SNF placement ?-Aspirin for DVT prophylaxis ?-Follow-up with Dr. Lyla Glassing in 2 weeks for wound check, x-ray ?  ?Hypertension ?-Patient no longer takes Norvasc ? ?Hyperlipidemia ?-Patient no longer takes atorvastatin ?  ?Alzheimer's dementia ?-Delirium  precaution ? ?Chronic diastolic heart failure ?-Does not appear to be decompensated at this time ?  ?Hypothyroidism ?-Patient no longer takes Synthroid ?-TSH 3.857 ? ?CKD stage IIIa ?-Stable ? ?Discharge Instructions ? ?Discharge Instructions   ? ? Increase activity slowly   Complete by: As directed ?  ? Leave dressing on - Keep it clean, dry, and intact until clinic visit   Complete by: As directed ?  ? No wound care   Complete by: As directed ?  ? ?  ? ?Allergies as of 01/12/2022   ? ?   Reactions  ? Acetaminophen Other (See Comments)  ? made her hyperactive  ? ?  ? ?  ?Medication List  ?  ? ?STOP taking these medications   ? ?amLODipine 5 MG tablet ?Commonly known as: NORVASC ?  ?atorvastatin 10 MG tablet ?Commonly known as: LIPITOR ?  ?gabapentin 300 MG capsule ?Commonly known as: NEURONTIN ?  ?levothyroxine 75 MCG tablet ?Commonly known as: SYNTHROID ?  ? ?  ? ?TAKE these medications   ? ?aspirin 81 MG chewable tablet ?Chew 1 tablet (81 mg total) by mouth 2 (two) times daily for 14 days. ?  ?HYDROcodone-acetaminophen 5-325 MG tablet ?Commonly known as: NORCO/VICODIN ?Take 1-2 tablets by mouth every 4 (four) hours as needed for moderate pain (pain score 4-6). ?  ?hydrOXYzine 25 MG capsule ?Commonly known as: VISTARIL ?Take 25 mg by mouth at bedtime. ?  ?VITAMIN E PO ?Take 1 capsule by mouth daily. ?  ? ?  ? ?  ?  ? ? ?  ?Discharge Care Instructions  ?(From admission, onward)  ?  ? ? ?  ? ?  Start     Ordered  ? 01/12/22 0000  Leave dressing  on - Keep it clean, dry, and intact until clinic visit       ? 01/12/22 1347  ? ?  ?  ? ?  ? ? Follow-up Information   ? ? [place], Aaron Edelman, MD. Schedule an appointment as soon as possible for a visit in 2 week(s).   ?Specialty: Orthopedic Surgery ?Why: For wound re-check, For suture removal ?Contact information: ?[address] ?[address] ?[place] [address] ?[phone] ? ? ?  ?  ? ?  ?  ? ?  ? ?Allergies  ?Allergen Reactions  ? Acetaminophen Other (See Comments)  ?   made her hyperactive  ? ? ?Consultations: ?Orthopedic surgery  ? ? ?Procedures/Studies: ?DG Chest 1 View ? ?Result Date: 01/09/2022 ?CLINICAL DATA:  Unwitnessed fall. EXAM: CHEST  1 VIEW COMPARISON:  None. FINDINGS: The heart size and mediastinal contours are within normal limits. Both lungs are clear. The visualized skeletal structures are unremarkable. IMPRESSION: No active disease. Electronically Signed   By: Marijo Conception M.D.   On: 01/09/2022 19:34  ? ?CT Head Wo Contrast ? ?Result Date: 01/09/2022 ?CLINICAL DATA:  Fall EXAM: CT HEAD WITHOUT CONTRAST CT CERVICAL SPINE WITHOUT CONTRAST TECHNIQUE: Multidetector CT imaging of the head and cervical spine was performed following the standard protocol without intravenous contrast. Multiplanar CT image reconstructions of the cervical spine were also generated. RADIATION DOSE REDUCTION: This exam was performed according to the departmental dose-optimization program which includes automated exposure control, adjustment of the mA and/or kV according to patient size and/or use of iterative reconstruction technique. COMPARISON:  Head CT dated April 22, 2016 FINDINGS: CT HEAD FINDINGS Brain: Global atrophy and chronic white matter ischemic change. Encephalomalacia of the right temporal lobe no evidence of acute infarction, hemorrhage, hydrocephalus, extra-axial collection or mass lesion/mass effect. Vascular: No hyperdense vessel or unexpected calcification. Skull: Normal. Negative for fracture or focal lesion. Sinuses/Orbits: No acute finding. Other: None. CT CERVICAL SPINE FINDINGS Alignment: Normal. Skull base and vertebrae: No acute fracture. No primary bone lesion or focal pathologic process. Soft tissues and spinal canal: No prevertebral fluid or swelling. No visible canal hematoma. Disc levels:  Mild multilevel degenerative changes. Upper chest: Negative. Other: None. IMPRESSION: 1. No acute intracranial abnormality. 2. No evidence of traumatic malalignment or acute  cervical spine fracture. Electronically Signed   By: Yetta Glassman M.D.   On: 01/09/2022 19:22  ? ?CT Cervical Spine Wo Contrast ? ?Result Date: 01/09/2022 ?CLINICAL DATA:  Fall EXAM: CT HEAD WITHOUT CONTRAST CT CERVICAL SPINE WITHOUT CONTRAST TECHNIQUE: Multidetector CT imaging of the head and cervical spine was performed following the standard protocol without intravenous contrast. Multiplanar CT image reconstructions of the cervical spine were also generated. RADIATION DOSE REDUCTION: This exam was performed according to the departmental dose-optimization program which includes automated exposure control, adjustment of the mA and/or kV according to patient size and/or use of iterative reconstruction technique. COMPARISON:  Head CT dated April 22, 2016 FINDINGS: CT HEAD FINDINGS Brain: Global atrophy and chronic white matter ischemic change. Encephalomalacia of the right temporal lobe no evidence of acute infarction, hemorrhage, hydrocephalus, extra-axial collection or mass lesion/mass effect. Vascular: No hyperdense vessel or unexpected calcification. Skull: Normal. Negative for fracture or focal lesion. Sinuses/Orbits: No acute finding. Other: None. CT CERVICAL SPINE FINDINGS Alignment: Normal. Skull base and vertebrae: No acute fracture. No primary bone lesion or focal pathologic process. Soft tissues and spinal canal: No prevertebral fluid or swelling. No visible canal hematoma. Disc levels:  Mild multilevel degenerative changes. Upper  chest: Negative. Other: None. IMPRESSION: 1. No acute intracranial abnormality. 2. No evidence of traumatic malalignment or acute cervical spine fracture. Electronically Signed   By: Yetta Glassman M.D.   On: 01/09/2022 19:22  ? ?DG Pelvis Portable ? ?Result Date: 01/10/2022 ?CLINICAL DATA:  Postop right total hip replacement. EXAM: PORTABLE PELVIS 1-2 VIEWS COMPARISON:  Right hip x-ray 01/09/2022. FINDINGS: There is a new right hip total arthroplasty in anatomic alignment.  There is soft tissue swelling, air and skin staples compatible with recent surgery. No acute fracture. Hardware appears intact. IMPRESSION: 1. New right hip arthroplasty in anatomic alignment. Electronically Signed   By: Carmin Muskrat

## 2022-01-12 NOTE — Plan of Care (Signed)
?  Problem: Health Behavior/Discharge Planning: ?Goal: Ability to manage health-related needs will improve ?Outcome: Adequate for Discharge ?  ?Problem: Clinical Measurements: ?Goal: Ability to maintain clinical measurements within normal limits will improve ?Outcome: Adequate for Discharge ?Goal: Will remain free from infection ?Outcome: Adequate for Discharge ?Goal: Diagnostic test results will improve ?Outcome: Adequate for Discharge ?Goal: Respiratory complications will improve ?Outcome: Adequate for Discharge ?Goal: Cardiovascular complication will be avoided ?Outcome: Adequate for Discharge ?  ?Problem: Activity: ?Goal: Risk for activity intolerance will decrease ?Outcome: Adequate for Discharge ?  ?Problem: Coping: ?Goal: Level of anxiety will decrease ?Outcome: Adequate for Discharge ?  ?Problem: Elimination: ?Goal: Will not experience complications related to bowel motility ?Outcome: Adequate for Discharge ?  ?Problem: Pain Managment: ?Goal: General experience of comfort will improve ?Outcome: Adequate for Discharge ?  ?Problem: Safety: ?Goal: Ability to remain free from injury will improve ?Outcome: Adequate for Discharge ?  ?Problem: Skin Integrity: ?Goal: Risk for impaired skin integrity will decrease ?Outcome: Adequate for Discharge ?  ?

## 2022-01-12 NOTE — TOC Transition Note (Signed)
Transition of Care (TOC) - CM/SW Discharge Note ? ? ?Patient Details  ?Name: Karen Pearson ?MRN: [id number] ?Date of Birth: 19-Jan-1925 ? ?Transition of Care (TOC) CM/SW Contact:  ?[place], LCSW ?Phone Number: ?01/12/2022, 2:06 PM ? ? ?Clinical Narrative:    ? ?Patient will DC to: [facility] ?Anticipated DC date: 01/12/22 ?Family notified: Daughter Desma Mcgregor ?Transport by: Corey Harold ? ? ?Per MD patient ready for DC to [facility]. RN, patient, patient's family, and facility notified of DC. Discharge Summary and FL2 sent to facility. RN to call report prior to discharge [phone] room 702P). DC packet on chart. Ambulance transport requested for patient.  ? ?CSW will sign off for now as social work intervention is no longer needed. Please consult [place] again if new needs arise.  ? ? ?Final next level of care: [place] ?Barriers to Discharge: No Barriers Identified ? ? ?Patient Goals and CMS Choice ?  ?  ?  ? ?Discharge Placement ?  ?           ?Patient chooses bed at: [facility] ?Patient to be transferred to facility by: [facility] ?Name of family member notified: Daughter Starla Link ?Patient and family notified of of transfer: 01/12/22 ? ?Discharge Plan and Services ?  ?  ?           ?  ?  ?  ?  ?  ?  ?  ?  ?  ?  ? ?Social Determinants of Health (SDOH) Interventions ?  ? ? ?Readmission Risk Interventions ?   ? View : No data to display.  ?  ?  ?  ? ? ? ? ? ?

## 2022-01-12 NOTE — Progress Notes (Addendum)
RN called [facility] for report, no answer. Will re attempt to call latter. ? ?1455hr Report called to Vianne Bulls RN received the report. Pt. Awaiting [facility]. ?

## 2022-01-12 NOTE — Progress Notes (Signed)
?PROGRESS NOTE ? ? ? ?Karen Pearson  XNT:[id number] DOB: [birth year] DOA: 01/09/2022 ?PCP: Cari Caraway, MD  ? ?  ?Brief Narrative:  ?Karen Pearson is a 88 year old female, married now for 62 years, medical history significant for moderate dementia, hard of hearing, legally blind, frequent falls, hypertension, hyperlipidemia, hypothyroidism, chronic diastolic CHF, presented to the ED following an unwitnessed fall at home on 01/09/2022, acute right hip pain and inability to weight-bear.  At baseline, ambulates at home with assistance from spouse, holding onto his arm.  Does not use a cane or a walker.  While spouse was in the laundry room, he heard patient fall, witnessed her lying on the right side and was unable to get up because of right hip pain.  Admitted for displaced right femoral neck fracture.  Orthopedics consulted and patient underwent right hip hemiarthroplasty 3/21. ? ?New events last 24 hours / Subjective: ?Patient sitting in recliner, breakfast in front of her which she is requesting assistance with.  She remains a poor historian due to her underlying dementia, but appears to be resting comfortably ? ?Assessment & Plan: ?  ?Principal Problem: ?  Closed displaced fracture of right femoral neck ([place]) ?Active Problems: ?  Essential hypertension ?  Hyperlipidemia ?  Dementia with behavioral disturbance ?  Chronic diastolic CHF (congestive heart failure) ([place]) ?  Hypothyroidism ?  Frequent falls ?  Debility ?  Hyperglycemia ?  Normocytic anemia ? ? ?Closed displaced fracture of the right femoral neck status post mechanical fall at home ?-Status post right hip hemiarthroplasty 3/21 ?-PT evaluation recommending SNF placement ?-Aspirin for DVT prophylaxis ?-Follow-up with Dr. Lyla Glassing in 2 weeks for wound check, x-ray ? ?Hypertension ?-Patient no longer takes Norvasc ? ?Hyperlipidemia ?-Patient no longer takes atorvastatin ? ?Alzheimer's dementia ?-Delirium precaution ? ?Chronic diastolic heart failure ?-Does  not appear to be decompensated at this time ? ?Hypothyroidism ?-Patient no longer takes Synthroid ?-TSH 3.857 ? ?CKD stage IIIa ?-Stable ? ? ?DVT prophylaxis:  ?SCDs Start: 01/11/22 9675 ? ?Code Status: DNR ?Family Communication: No family at bedside ?Disposition Plan:  ?Status is: Inpatient ?Remains inpatient appropriate because: SNF placement ? ?Antimicrobials:  ?Anti-infectives (From admission, onward)  ? ? Start     Dose/Rate Route Frequency Ordered Stop  ? 01/11/22 0800  ceFAZolin (ANCEF) IVPB 2g/100 mL premix       ? 2 g ?200 mL/hr over 30 Minutes Intravenous Every 6 hours 01/11/22 0641 01/11/22 1412  ? 01/10/22 0600  ceFAZolin (ANCEF) IVPB 2g/100 mL premix       ? 2 g ?200 mL/hr over 30 Minutes Intravenous On call to O.R. 01/09/22 2338 01/10/22 1738  ? ?  ? ? ? ?Objective: ?Vitals:  ? 01/11/22 1149 01/11/22 1732 01/11/22 2057 01/12/22 0430  ?BP: 127/71 (!) 141/68 138/77 125/76  ?Pulse: 96 100 (!) 108 (!) 107  ?Resp: '18 18 16 16  '$ ?Temp: 97.6 ?F (36.4 ?C) 97.9 ?F (36.6 ?C) 97.7 ?F (36.5 ?C) 98.1 ?F (36.7 ?C)  ?TempSrc: Oral Oral Oral Oral  ?SpO2: 93% 96% 94% 96%  ?Weight:      ?Height:      ? ? ?Intake/Output Summary (Last 24 hours) at 01/12/2022 1026 ?Last data filed at 01/11/2022 1833 ?Gross per 24 hour  ?Intake 360 ml  ?Output 100 ml  ?Net 260 ml  ? ? ?Filed Weights  ? 01/09/22 1819 01/10/22 0030 01/10/22 0757  ?Weight: 56.7 kg 57.4 kg 57.1 kg  ? ? ?Examination:  ?General exam: Appears calm and comfortable, hard  of hearing ?Respiratory system: Clear to auscultation. Respiratory effort normal. No respiratory distress.  Room air ?Cardiovascular system: S1 & S2 heard ?Gastrointestinal system: Abdomen is nondistended, soft  ?Central nervous system: Alert ?Psychiatry: Mood & affect appropriate.  ? ?Data Reviewed: I have personally reviewed following labs and imaging studies ? ?CBC: ?Recent Labs  ?Lab 01/09/22 ?1836 01/10/22 ?5732 01/11/22 ?2025  ?WBC 10.1 10.3 10.3  ?NEUTROABS 9.0*  --   --   ?HGB 12.0 11.7* 10.7*   ?HCT 36.1 35.3* 33.2*  ?MCV 93.3 91.9 92.7  ?PLT 247 258 233  ? ? ?Basic Metabolic Panel: ?Recent Labs  ?Lab 01/09/22 ?1836 01/09/22 ?2130 01/10/22 ?4270 01/11/22 ?6237 01/11/22 ?0732  ?NA 140  --  136 135 134*  ?K 4.0  --  3.7 4.2 4.1  ?CL 106  --  104 103 102  ?CO2 25  --  18* 21* 23  ?GLUCOSE 146*  --  157* 128* 103*  ?BUN 20  --  '19 23 23  '$ ?CREATININE 1.07*  --  0.92 0.90 0.98  ?CALCIUM 8.8*  --  8.8* 8.5* 8.4*  ?MG  --  2.1  --   --   --   ?PHOS  --  2.8  --   --   --   ? ? ?GFR: ?Estimated Creatinine Clearance: 31.6 mL/min (by C-G formula based on SCr of 0.98 mg/dL). ?Liver Function Tests: ?Recent Labs  ?Lab 01/09/22 ?2130 01/10/22 ?0034  ?AST 22  --   ?ALT 13  --   ?ALKPHOS 73  --   ?BILITOT 0.9  --   ?PROT 7.3  --   ?ALBUMIN 4.0 3.8  ? ? ?No results for input(s): LIPASE, AMYLASE in the last 168 hours. ?No results for input(s): AMMONIA in the last 168 hours. ?Coagulation Profile: ?Recent Labs  ?Lab 01/09/22 ?1836  ?INR 1.1  ? ? ?Cardiac Enzymes: ?Recent Labs  ?Lab 01/09/22 ?2130  ?CKTOTAL 115  ? ? ?BNP (last 3 results) ?No results for input(s): PROBNP in the last 8760 hours. ?HbA1C: ?No results for input(s): HGBA1C in the last 72 hours. ?CBG: ?No results for input(s): GLUCAP in the last 168 hours. ?Lipid Profile: ?No results for input(s): CHOL, HDL, LDLCALC, TRIG, CHOLHDL, LDLDIRECT in the last 72 hours. ?Thyroid Function Tests: ?Recent Labs  ?  01/10/22 ?0034  ?TSH 3.857  ? ? ?Anemia Panel: ?No results for input(s): VITAMINB12, FOLATE, FERRITIN, TIBC, IRON, RETICCTPCT in the last 72 hours. ?Sepsis Labs: ?No results for input(s): PROCALCITON, LATICACIDVEN in the last 168 hours. ? ?Recent Results (from the past 240 hour(s))  ?Resp Panel by RT-PCR (Flu A&B, Covid) Nasopharyngeal Swab     Status: None  ? Collection Time: 01/09/22  9:21 PM  ? Specimen: Nasopharyngeal Swab; Nasopharyngeal(NP) swabs in vial transport medium  ?Result Value Ref Range Status  ? SARS Coronavirus 2 by RT PCR NEGATIVE NEGATIVE Final  ?   Comment: (NOTE) ?SARS-CoV-2 target nucleic acids are NOT DETECTED. ? ?The SARS-CoV-2 RNA is generally detectable in upper respiratory ?specimens during the acute phase of infection. The lowest ?concentration of SARS-CoV-2 viral copies this assay can detect is ?138 copies/mL. A negative result does not preclude SARS-Cov-2 ?infection and should not be used as the sole basis for treatment or ?other patient management decisions. A negative result may occur with  ?improper specimen collection/handling, submission of specimen other ?than nasopharyngeal swab, presence of viral mutation(s) within the ?areas targeted by this assay, and inadequate number of viral ?copies(<138 copies/mL). A negative result  must be combined with ?clinical observations, patient history, and epidemiological ?information. The expected result is Negative. ? ?Fact Sheet for Patients:  ?EntrepreneurPulse.com.au ? ?Fact Sheet for Healthcare Providers:  ?IncredibleEmployment.be ? ?This test is no t yet approved or cleared by the [place] FDA and  ?has been authorized for detection and/or diagnosis of SARS-CoV-2 by ?FDA under an Emergency Use Authorization (EUA). This EUA will remain  ?in effect (meaning this test can be used) for the duration of the ?COVID-19 declaration under Section 564(b)(1) of the Act, 21 ?U.S.C.section 360bbb-3(b)(1), unless the authorization is terminated  ?or revoked sooner.  ? ? ?  ? Influenza A by PCR NEGATIVE NEGATIVE Final  ? Influenza B by PCR NEGATIVE NEGATIVE Final  ?  Comment: (NOTE) ?The Xpert Xpress SARS-CoV-2/FLU/RSV plus assay is intended as an aid ?in the diagnosis of influenza from Nasopharyngeal swab specimens and ?should not be used as a sole basis for treatment. Nasal washings and ?aspirates are unacceptable for Xpert Xpress SARS-CoV-2/FLU/RSV ?testing. ? ?Fact Sheet for Patients: ?EntrepreneurPulse.com.au ? ?Fact Sheet for Healthcare  Providers: ?IncredibleEmployment.be ? ?This test is not yet approved or cleared by the [place] FDA and ?has been authorized for detection and/or diagnosis of SARS-CoV-2 by ?FDA under an Emergency U

## 2022-01-13 DIAGNOSIS — F03B0 Unspecified dementia, moderate, without behavioral disturbance, psychotic disturbance, mood disturbance, and anxiety: Secondary | ICD-10-CM | POA: Diagnosis not present

## 2022-01-13 DIAGNOSIS — E785 Hyperlipidemia, unspecified: Secondary | ICD-10-CM | POA: Diagnosis not present

## 2022-01-13 DIAGNOSIS — I110 Hypertensive heart disease with heart failure: Secondary | ICD-10-CM | POA: Diagnosis not present

## 2022-01-13 DIAGNOSIS — S72001A Fracture of unspecified part of neck of right femur, initial encounter for closed fracture: Secondary | ICD-10-CM | POA: Diagnosis not present

## 2022-01-13 DIAGNOSIS — D649 Anemia, unspecified: Secondary | ICD-10-CM | POA: Diagnosis not present

## 2022-01-13 DIAGNOSIS — E039 Hypothyroidism, unspecified: Secondary | ICD-10-CM | POA: Diagnosis not present

## 2022-01-13 DIAGNOSIS — I5032 Chronic diastolic (congestive) heart failure: Secondary | ICD-10-CM | POA: Diagnosis not present

## 2022-01-13 DIAGNOSIS — Z22322 Carrier or suspected carrier of Methicillin resistant Staphylococcus aureus: Secondary | ICD-10-CM | POA: Diagnosis not present

## 2022-01-13 DIAGNOSIS — R262 Difficulty in walking, not elsewhere classified: Secondary | ICD-10-CM | POA: Diagnosis not present

## 2022-01-13 DIAGNOSIS — I10 Essential (primary) hypertension: Secondary | ICD-10-CM | POA: Diagnosis not present

## 2022-01-16 DIAGNOSIS — F5102 Adjustment insomnia: Secondary | ICD-10-CM | POA: Diagnosis not present

## 2022-01-16 DIAGNOSIS — F03918 Unspecified dementia, unspecified severity, with other behavioral disturbance: Secondary | ICD-10-CM | POA: Diagnosis not present

## 2022-01-19 DIAGNOSIS — S72001A Fracture of unspecified part of neck of right femur, initial encounter for closed fracture: Secondary | ICD-10-CM | POA: Diagnosis not present

## 2022-01-19 DIAGNOSIS — M6281 Muscle weakness (generalized): Secondary | ICD-10-CM | POA: Diagnosis not present

## 2022-01-19 DIAGNOSIS — I509 Heart failure, unspecified: Secondary | ICD-10-CM | POA: Diagnosis not present

## 2022-01-19 DIAGNOSIS — S72001S Fracture of unspecified part of neck of right femur, sequela: Secondary | ICD-10-CM | POA: Diagnosis not present

## 2022-01-19 DIAGNOSIS — R2681 Unsteadiness on feet: Secondary | ICD-10-CM | POA: Diagnosis not present

## 2022-01-19 DIAGNOSIS — Z9181 History of falling: Secondary | ICD-10-CM | POA: Diagnosis not present

## 2022-01-19 DIAGNOSIS — H548 Legal blindness, as defined in USA: Secondary | ICD-10-CM | POA: Diagnosis not present

## 2022-01-19 DIAGNOSIS — F0390 Unspecified dementia without behavioral disturbance: Secondary | ICD-10-CM | POA: Diagnosis not present

## 2022-01-24 DIAGNOSIS — S72031D Displaced midcervical fracture of right femur, subsequent encounter for closed fracture with routine healing: Secondary | ICD-10-CM | POA: Diagnosis not present

## 2022-01-30 DIAGNOSIS — S72001S Fracture of unspecified part of neck of right femur, sequela: Secondary | ICD-10-CM | POA: Diagnosis not present

## 2022-01-30 DIAGNOSIS — D649 Anemia, unspecified: Secondary | ICD-10-CM | POA: Diagnosis not present

## 2022-01-30 DIAGNOSIS — F03B0 Unspecified dementia, moderate, without behavioral disturbance, psychotic disturbance, mood disturbance, and anxiety: Secondary | ICD-10-CM | POA: Diagnosis not present

## 2022-01-30 DIAGNOSIS — H548 Legal blindness, as defined in USA: Secondary | ICD-10-CM | POA: Diagnosis not present

## 2022-01-30 DIAGNOSIS — I5032 Chronic diastolic (congestive) heart failure: Secondary | ICD-10-CM | POA: Diagnosis not present

## 2022-01-30 DIAGNOSIS — F0390 Unspecified dementia without behavioral disturbance: Secondary | ICD-10-CM | POA: Diagnosis not present

## 2022-01-30 DIAGNOSIS — R2681 Unsteadiness on feet: Secondary | ICD-10-CM | POA: Diagnosis not present

## 2022-01-30 DIAGNOSIS — S72001A Fracture of unspecified part of neck of right femur, initial encounter for closed fracture: Secondary | ICD-10-CM | POA: Diagnosis not present

## 2022-01-30 DIAGNOSIS — I10 Essential (primary) hypertension: Secondary | ICD-10-CM | POA: Diagnosis not present

## 2022-01-30 DIAGNOSIS — Z9181 History of falling: Secondary | ICD-10-CM | POA: Diagnosis not present

## 2022-01-30 DIAGNOSIS — I509 Heart failure, unspecified: Secondary | ICD-10-CM | POA: Diagnosis not present

## 2022-01-30 DIAGNOSIS — M6281 Muscle weakness (generalized): Secondary | ICD-10-CM | POA: Diagnosis not present

## 2022-02-01 DIAGNOSIS — R2681 Unsteadiness on feet: Secondary | ICD-10-CM | POA: Diagnosis not present

## 2022-02-01 DIAGNOSIS — Z9181 History of falling: Secondary | ICD-10-CM | POA: Diagnosis not present

## 2022-02-01 DIAGNOSIS — S72001A Fracture of unspecified part of neck of right femur, initial encounter for closed fracture: Secondary | ICD-10-CM | POA: Diagnosis not present

## 2022-02-01 DIAGNOSIS — M6281 Muscle weakness (generalized): Secondary | ICD-10-CM | POA: Diagnosis not present

## 2022-02-01 DIAGNOSIS — S72001S Fracture of unspecified part of neck of right femur, sequela: Secondary | ICD-10-CM | POA: Diagnosis not present

## 2022-02-01 DIAGNOSIS — F0390 Unspecified dementia without behavioral disturbance: Secondary | ICD-10-CM | POA: Diagnosis not present

## 2022-02-01 DIAGNOSIS — I509 Heart failure, unspecified: Secondary | ICD-10-CM | POA: Diagnosis not present

## 2022-02-01 DIAGNOSIS — H548 Legal blindness, as defined in USA: Secondary | ICD-10-CM | POA: Diagnosis not present

## 2022-02-02 DIAGNOSIS — S72001S Fracture of unspecified part of neck of right femur, sequela: Secondary | ICD-10-CM | POA: Diagnosis not present

## 2022-02-02 DIAGNOSIS — L989 Disorder of the skin and subcutaneous tissue, unspecified: Secondary | ICD-10-CM | POA: Diagnosis not present

## 2022-02-02 DIAGNOSIS — M6281 Muscle weakness (generalized): Secondary | ICD-10-CM | POA: Diagnosis not present

## 2022-02-02 DIAGNOSIS — K219 Gastro-esophageal reflux disease without esophagitis: Secondary | ICD-10-CM | POA: Diagnosis not present

## 2022-02-02 DIAGNOSIS — H548 Legal blindness, as defined in USA: Secondary | ICD-10-CM | POA: Diagnosis not present

## 2022-02-02 DIAGNOSIS — R634 Abnormal weight loss: Secondary | ICD-10-CM | POA: Diagnosis not present

## 2022-02-02 DIAGNOSIS — Z711 Person with feared health complaint in whom no diagnosis is made: Secondary | ICD-10-CM | POA: Diagnosis not present

## 2022-02-02 DIAGNOSIS — Z9181 History of falling: Secondary | ICD-10-CM | POA: Diagnosis not present

## 2022-02-02 DIAGNOSIS — F0390 Unspecified dementia without behavioral disturbance: Secondary | ICD-10-CM | POA: Diagnosis not present

## 2022-02-02 DIAGNOSIS — S72001A Fracture of unspecified part of neck of right femur, initial encounter for closed fracture: Secondary | ICD-10-CM | POA: Diagnosis not present

## 2022-02-02 DIAGNOSIS — I509 Heart failure, unspecified: Secondary | ICD-10-CM | POA: Diagnosis not present

## 2022-02-02 DIAGNOSIS — R2681 Unsteadiness on feet: Secondary | ICD-10-CM | POA: Diagnosis not present

## 2022-02-06 DIAGNOSIS — S72001S Fracture of unspecified part of neck of right femur, sequela: Secondary | ICD-10-CM | POA: Diagnosis not present

## 2022-02-06 DIAGNOSIS — M6281 Muscle weakness (generalized): Secondary | ICD-10-CM | POA: Diagnosis not present

## 2022-02-06 DIAGNOSIS — Z9181 History of falling: Secondary | ICD-10-CM | POA: Diagnosis not present

## 2022-02-06 DIAGNOSIS — I509 Heart failure, unspecified: Secondary | ICD-10-CM | POA: Diagnosis not present

## 2022-02-06 DIAGNOSIS — H548 Legal blindness, as defined in USA: Secondary | ICD-10-CM | POA: Diagnosis not present

## 2022-02-06 DIAGNOSIS — F0390 Unspecified dementia without behavioral disturbance: Secondary | ICD-10-CM | POA: Diagnosis not present

## 2022-02-06 DIAGNOSIS — S72001A Fracture of unspecified part of neck of right femur, initial encounter for closed fracture: Secondary | ICD-10-CM | POA: Diagnosis not present

## 2022-02-06 DIAGNOSIS — R2681 Unsteadiness on feet: Secondary | ICD-10-CM | POA: Diagnosis not present

## 2022-02-07 DIAGNOSIS — L988 Other specified disorders of the skin and subcutaneous tissue: Secondary | ICD-10-CM | POA: Diagnosis not present

## 2022-02-08 DIAGNOSIS — R2681 Unsteadiness on feet: Secondary | ICD-10-CM | POA: Diagnosis not present

## 2022-02-08 DIAGNOSIS — S72001A Fracture of unspecified part of neck of right femur, initial encounter for closed fracture: Secondary | ICD-10-CM | POA: Diagnosis not present

## 2022-02-08 DIAGNOSIS — H548 Legal blindness, as defined in USA: Secondary | ICD-10-CM | POA: Diagnosis not present

## 2022-02-08 DIAGNOSIS — S72001S Fracture of unspecified part of neck of right femur, sequela: Secondary | ICD-10-CM | POA: Diagnosis not present

## 2022-02-08 DIAGNOSIS — Z9181 History of falling: Secondary | ICD-10-CM | POA: Diagnosis not present

## 2022-02-08 DIAGNOSIS — I509 Heart failure, unspecified: Secondary | ICD-10-CM | POA: Diagnosis not present

## 2022-02-08 DIAGNOSIS — F0390 Unspecified dementia without behavioral disturbance: Secondary | ICD-10-CM | POA: Diagnosis not present

## 2022-02-08 DIAGNOSIS — M6281 Muscle weakness (generalized): Secondary | ICD-10-CM | POA: Diagnosis not present

## 2022-02-09 DIAGNOSIS — F0390 Unspecified dementia without behavioral disturbance: Secondary | ICD-10-CM | POA: Diagnosis not present

## 2022-02-09 DIAGNOSIS — M6281 Muscle weakness (generalized): Secondary | ICD-10-CM | POA: Diagnosis not present

## 2022-02-09 DIAGNOSIS — R2681 Unsteadiness on feet: Secondary | ICD-10-CM | POA: Diagnosis not present

## 2022-02-09 DIAGNOSIS — S72001A Fracture of unspecified part of neck of right femur, initial encounter for closed fracture: Secondary | ICD-10-CM | POA: Diagnosis not present

## 2022-02-09 DIAGNOSIS — Z9181 History of falling: Secondary | ICD-10-CM | POA: Diagnosis not present

## 2022-02-09 DIAGNOSIS — H548 Legal blindness, as defined in USA: Secondary | ICD-10-CM | POA: Diagnosis not present

## 2022-02-09 DIAGNOSIS — S72001S Fracture of unspecified part of neck of right femur, sequela: Secondary | ICD-10-CM | POA: Diagnosis not present

## 2022-02-09 DIAGNOSIS — I509 Heart failure, unspecified: Secondary | ICD-10-CM | POA: Diagnosis not present

## 2022-02-13 DIAGNOSIS — S72001S Fracture of unspecified part of neck of right femur, sequela: Secondary | ICD-10-CM | POA: Diagnosis not present

## 2022-02-13 DIAGNOSIS — I509 Heart failure, unspecified: Secondary | ICD-10-CM | POA: Diagnosis not present

## 2022-02-13 DIAGNOSIS — M6281 Muscle weakness (generalized): Secondary | ICD-10-CM | POA: Diagnosis not present

## 2022-02-13 DIAGNOSIS — H548 Legal blindness, as defined in USA: Secondary | ICD-10-CM | POA: Diagnosis not present

## 2022-02-13 DIAGNOSIS — S72001A Fracture of unspecified part of neck of right femur, initial encounter for closed fracture: Secondary | ICD-10-CM | POA: Diagnosis not present

## 2022-02-13 DIAGNOSIS — F0390 Unspecified dementia without behavioral disturbance: Secondary | ICD-10-CM | POA: Diagnosis not present

## 2022-02-13 DIAGNOSIS — Z9181 History of falling: Secondary | ICD-10-CM | POA: Diagnosis not present

## 2022-02-13 DIAGNOSIS — R2681 Unsteadiness on feet: Secondary | ICD-10-CM | POA: Diagnosis not present

## 2022-02-14 DIAGNOSIS — L988 Other specified disorders of the skin and subcutaneous tissue: Secondary | ICD-10-CM | POA: Diagnosis not present

## 2022-02-15 DIAGNOSIS — L989 Disorder of the skin and subcutaneous tissue, unspecified: Secondary | ICD-10-CM | POA: Diagnosis not present

## 2022-02-15 DIAGNOSIS — M6281 Muscle weakness (generalized): Secondary | ICD-10-CM | POA: Diagnosis not present

## 2022-02-15 DIAGNOSIS — E785 Hyperlipidemia, unspecified: Secondary | ICD-10-CM | POA: Diagnosis not present

## 2022-02-15 DIAGNOSIS — S72001S Fracture of unspecified part of neck of right femur, sequela: Secondary | ICD-10-CM | POA: Diagnosis not present

## 2022-02-15 DIAGNOSIS — R262 Difficulty in walking, not elsewhere classified: Secondary | ICD-10-CM | POA: Diagnosis not present

## 2022-02-15 DIAGNOSIS — D649 Anemia, unspecified: Secondary | ICD-10-CM | POA: Diagnosis not present

## 2022-02-15 DIAGNOSIS — H548 Legal blindness, as defined in USA: Secondary | ICD-10-CM | POA: Diagnosis not present

## 2022-02-15 DIAGNOSIS — S72001A Fracture of unspecified part of neck of right femur, initial encounter for closed fracture: Secondary | ICD-10-CM | POA: Diagnosis not present

## 2022-02-15 DIAGNOSIS — R2681 Unsteadiness on feet: Secondary | ICD-10-CM | POA: Diagnosis not present

## 2022-02-15 DIAGNOSIS — F03B0 Unspecified dementia, moderate, without behavioral disturbance, psychotic disturbance, mood disturbance, and anxiety: Secondary | ICD-10-CM | POA: Diagnosis not present

## 2022-02-15 DIAGNOSIS — I10 Essential (primary) hypertension: Secondary | ICD-10-CM | POA: Diagnosis not present

## 2022-02-15 DIAGNOSIS — F0390 Unspecified dementia without behavioral disturbance: Secondary | ICD-10-CM | POA: Diagnosis not present

## 2022-02-15 DIAGNOSIS — Z9181 History of falling: Secondary | ICD-10-CM | POA: Diagnosis not present

## 2022-02-15 DIAGNOSIS — I5032 Chronic diastolic (congestive) heart failure: Secondary | ICD-10-CM | POA: Diagnosis not present

## 2022-02-15 DIAGNOSIS — I509 Heart failure, unspecified: Secondary | ICD-10-CM | POA: Diagnosis not present

## 2022-02-17 DIAGNOSIS — G309 Alzheimer's disease, unspecified: Secondary | ICD-10-CM | POA: Diagnosis not present

## 2022-02-17 DIAGNOSIS — F02B0 Dementia in other diseases classified elsewhere, moderate, without behavioral disturbance, psychotic disturbance, mood disturbance, and anxiety: Secondary | ICD-10-CM | POA: Diagnosis not present

## 2022-02-17 DIAGNOSIS — Z5982 Transportation insecurity: Secondary | ICD-10-CM | POA: Diagnosis not present

## 2022-02-17 DIAGNOSIS — I5032 Chronic diastolic (congestive) heart failure: Secondary | ICD-10-CM | POA: Diagnosis not present

## 2022-02-17 DIAGNOSIS — Z22322 Carrier or suspected carrier of Methicillin resistant Staphylococcus aureus: Secondary | ICD-10-CM | POA: Diagnosis not present

## 2022-02-17 DIAGNOSIS — H548 Legal blindness, as defined in USA: Secondary | ICD-10-CM | POA: Diagnosis not present

## 2022-02-17 DIAGNOSIS — N1831 Chronic kidney disease, stage 3a: Secondary | ICD-10-CM | POA: Diagnosis not present

## 2022-02-17 DIAGNOSIS — Z556 Problems related to health literacy: Secondary | ICD-10-CM | POA: Diagnosis not present

## 2022-02-17 DIAGNOSIS — Z9181 History of falling: Secondary | ICD-10-CM | POA: Diagnosis not present

## 2022-02-17 DIAGNOSIS — E039 Hypothyroidism, unspecified: Secondary | ICD-10-CM | POA: Diagnosis not present

## 2022-02-17 DIAGNOSIS — S72001D Fracture of unspecified part of neck of right femur, subsequent encounter for closed fracture with routine healing: Secondary | ICD-10-CM | POA: Diagnosis not present

## 2022-02-17 DIAGNOSIS — Z96641 Presence of right artificial hip joint: Secondary | ICD-10-CM | POA: Diagnosis not present

## 2022-02-17 DIAGNOSIS — D649 Anemia, unspecified: Secondary | ICD-10-CM | POA: Diagnosis not present

## 2022-02-17 DIAGNOSIS — I130 Hypertensive heart and chronic kidney disease with heart failure and stage 1 through stage 4 chronic kidney disease, or unspecified chronic kidney disease: Secondary | ICD-10-CM | POA: Diagnosis not present

## 2022-02-17 DIAGNOSIS — I672 Cerebral atherosclerosis: Secondary | ICD-10-CM | POA: Diagnosis not present

## 2022-02-18 DIAGNOSIS — R269 Unspecified abnormalities of gait and mobility: Secondary | ICD-10-CM | POA: Diagnosis not present

## 2022-02-18 DIAGNOSIS — S72001A Fracture of unspecified part of neck of right femur, initial encounter for closed fracture: Secondary | ICD-10-CM | POA: Diagnosis not present

## 2022-02-18 DIAGNOSIS — I10 Essential (primary) hypertension: Secondary | ICD-10-CM | POA: Diagnosis not present

## 2022-02-20 DIAGNOSIS — I5032 Chronic diastolic (congestive) heart failure: Secondary | ICD-10-CM | POA: Diagnosis not present

## 2022-02-20 DIAGNOSIS — S72001D Fracture of unspecified part of neck of right femur, subsequent encounter for closed fracture with routine healing: Secondary | ICD-10-CM | POA: Diagnosis not present

## 2022-02-20 DIAGNOSIS — G309 Alzheimer's disease, unspecified: Secondary | ICD-10-CM | POA: Diagnosis not present

## 2022-02-20 DIAGNOSIS — I130 Hypertensive heart and chronic kidney disease with heart failure and stage 1 through stage 4 chronic kidney disease, or unspecified chronic kidney disease: Secondary | ICD-10-CM | POA: Diagnosis not present

## 2022-02-20 DIAGNOSIS — Z96641 Presence of right artificial hip joint: Secondary | ICD-10-CM | POA: Diagnosis not present

## 2022-02-20 DIAGNOSIS — N1831 Chronic kidney disease, stage 3a: Secondary | ICD-10-CM | POA: Diagnosis not present

## 2022-02-21 DIAGNOSIS — I130 Hypertensive heart and chronic kidney disease with heart failure and stage 1 through stage 4 chronic kidney disease, or unspecified chronic kidney disease: Secondary | ICD-10-CM | POA: Diagnosis not present

## 2022-02-21 DIAGNOSIS — S72001D Fracture of unspecified part of neck of right femur, subsequent encounter for closed fracture with routine healing: Secondary | ICD-10-CM | POA: Diagnosis not present

## 2022-02-21 DIAGNOSIS — Z96641 Presence of right artificial hip joint: Secondary | ICD-10-CM | POA: Diagnosis not present

## 2022-02-21 DIAGNOSIS — I5032 Chronic diastolic (congestive) heart failure: Secondary | ICD-10-CM | POA: Diagnosis not present

## 2022-02-21 DIAGNOSIS — N1831 Chronic kidney disease, stage 3a: Secondary | ICD-10-CM | POA: Diagnosis not present

## 2022-02-21 DIAGNOSIS — G309 Alzheimer's disease, unspecified: Secondary | ICD-10-CM | POA: Diagnosis not present

## 2022-02-22 DIAGNOSIS — Z96641 Presence of right artificial hip joint: Secondary | ICD-10-CM | POA: Diagnosis not present

## 2022-02-22 DIAGNOSIS — G309 Alzheimer's disease, unspecified: Secondary | ICD-10-CM | POA: Diagnosis not present

## 2022-02-22 DIAGNOSIS — N1831 Chronic kidney disease, stage 3a: Secondary | ICD-10-CM | POA: Diagnosis not present

## 2022-02-22 DIAGNOSIS — S72001D Fracture of unspecified part of neck of right femur, subsequent encounter for closed fracture with routine healing: Secondary | ICD-10-CM | POA: Diagnosis not present

## 2022-02-22 DIAGNOSIS — I5032 Chronic diastolic (congestive) heart failure: Secondary | ICD-10-CM | POA: Diagnosis not present

## 2022-02-22 DIAGNOSIS — I130 Hypertensive heart and chronic kidney disease with heart failure and stage 1 through stage 4 chronic kidney disease, or unspecified chronic kidney disease: Secondary | ICD-10-CM | POA: Diagnosis not present

## 2022-02-23 DIAGNOSIS — G309 Alzheimer's disease, unspecified: Secondary | ICD-10-CM | POA: Diagnosis not present

## 2022-02-23 DIAGNOSIS — N1831 Chronic kidney disease, stage 3a: Secondary | ICD-10-CM | POA: Diagnosis not present

## 2022-02-23 DIAGNOSIS — I130 Hypertensive heart and chronic kidney disease with heart failure and stage 1 through stage 4 chronic kidney disease, or unspecified chronic kidney disease: Secondary | ICD-10-CM | POA: Diagnosis not present

## 2022-02-23 DIAGNOSIS — Z96641 Presence of right artificial hip joint: Secondary | ICD-10-CM | POA: Diagnosis not present

## 2022-02-23 DIAGNOSIS — I5032 Chronic diastolic (congestive) heart failure: Secondary | ICD-10-CM | POA: Diagnosis not present

## 2022-02-23 DIAGNOSIS — S72001D Fracture of unspecified part of neck of right femur, subsequent encounter for closed fracture with routine healing: Secondary | ICD-10-CM | POA: Diagnosis not present

## 2022-02-25 ENCOUNTER — Emergency Department (HOSPITAL_COMMUNITY): Payer: Medicare Other

## 2022-02-25 ENCOUNTER — Emergency Department (HOSPITAL_COMMUNITY)
Admission: EM | Admit: 2022-02-25 | Discharge: 2022-02-25 | Disposition: A | Discharge status: Home / Self Care | Payer: Medicare Other | Attending: Emergency Medicine | Admitting: Emergency Medicine

## 2022-02-25 ENCOUNTER — Encounter (HOSPITAL_COMMUNITY): Payer: Self-pay

## 2022-02-25 ENCOUNTER — Other Ambulatory Visit: Payer: Self-pay

## 2022-02-25 DIAGNOSIS — Y92009 Unspecified place in unspecified non-institutional (private) residence as the place of occurrence of the external cause: Secondary | ICD-10-CM | POA: Insufficient documentation

## 2022-02-25 DIAGNOSIS — S0181XA Laceration without foreign body of other part of head, initial encounter: Secondary | ICD-10-CM | POA: Diagnosis not present

## 2022-02-25 DIAGNOSIS — W06XXXA Fall from bed, initial encounter: Secondary | ICD-10-CM | POA: Insufficient documentation

## 2022-02-25 DIAGNOSIS — F0390 Unspecified dementia without behavioral disturbance: Secondary | ICD-10-CM | POA: Diagnosis not present

## 2022-02-25 DIAGNOSIS — M47812 Spondylosis without myelopathy or radiculopathy, cervical region: Secondary | ICD-10-CM | POA: Diagnosis not present

## 2022-02-25 DIAGNOSIS — M25559 Pain in unspecified hip: Secondary | ICD-10-CM | POA: Diagnosis not present

## 2022-02-25 DIAGNOSIS — W19XXXA Unspecified fall, initial encounter: Secondary | ICD-10-CM | POA: Diagnosis not present

## 2022-02-25 DIAGNOSIS — S0993XA Unspecified injury of face, initial encounter: Secondary | ICD-10-CM | POA: Diagnosis not present

## 2022-02-25 DIAGNOSIS — S01111A Laceration without foreign body of right eyelid and periocular area, initial encounter: Secondary | ICD-10-CM | POA: Insufficient documentation

## 2022-02-25 DIAGNOSIS — S199XXA Unspecified injury of neck, initial encounter: Secondary | ICD-10-CM | POA: Diagnosis not present

## 2022-02-25 DIAGNOSIS — S60413A Abrasion of left middle finger, initial encounter: Secondary | ICD-10-CM | POA: Insufficient documentation

## 2022-02-25 NOTE — ED Triage Notes (Signed)
[facility] reports pt coming from home. Husband states she fell out of the bed. Pt w/ right eye lid and right cheek. Pt w/recent right hip surgery on 01/09/22. Pt ambulatory with assistance on scene. ?

## 2022-02-25 NOTE — ED Notes (Signed)
Patient and husband went home with grandaughter.  ?

## 2022-02-25 NOTE — ED Provider Notes (Addendum)
?[facility]PT ?Provider Note ? ? ?CSN: [id number] ?Arrival date & time: 02/25/22  1736 ? ?  ? ?History ? ?Chief Complaint  ?Patient presents with  ? Fall  ? ? ?Karen Pearson is a 88 y.o. female. ? ?Patient is a 88 year old female who presents from home after she fell out of bed.  She has a small laceration to her right eyelid and some swelling to the right side of her face.  No other evident injuries per EMS.  Currently it is unclear how she fell out of bed.  EMS was not quite sure.  She does have a history of a recent hip surgery related to hip fracture in March of this year.  She was ambulatory at the scene.  She is not on anticoagulants.  History is limited due to her dementia. ? ? ?  ? ?Home Medications ?Prior to Admission medications   ?Medication Sig Start Date End Date Taking? Authorizing Provider  ?HYDROcodone-acetaminophen (NORCO/VICODIN) 5-325 MG tablet Take 1-2 tablets by mouth every 4 (four) hours as needed for moderate pain (pain score 4-6). 01/12/22   Dessa Phi, DO  ?hydrOXYzine (VISTARIL) 25 MG capsule Take 25 mg by mouth at bedtime. 12/26/21   [provider]  ?VITAMIN E PO Take 1 capsule by mouth daily.    [provider]  ?   ? ?Allergies    ?Acetaminophen   ? ?Review of Systems   ?Review of Systems  ?Unable to perform ROS: Dementia  ? ?Physical Exam ?Updated Vital Signs ?BP (!) 160/91 (BP Location: Left Arm)   Pulse 81   Temp 98.7 ?F (37.1 ?C) (Oral)   Resp 18   Ht '5\' 6"'$  (1.676 m)   Wt 56.7 kg   SpO2 98%   BMI 20.18 kg/m?  ?Physical Exam ?Constitutional:   ?   Appearance: Normal appearance.  ?HENT:  ?   Head: Atraumatic.  ?   Comments: Small superficial laceration to her upper right eyelid.  There is also an abrasion and some swelling over her right maxilla.  Extraocular movements are intact.  No other evident facial injuries. ?   Mouth/Throat:  ?   Mouth: Mucous membranes are moist.  ?Eyes:  ?   Extraocular Movements: Extraocular movements  intact.  ?   Conjunctiva/sclera: Conjunctivae normal.  ?   Pupils: Pupils are equal, round, and reactive to light.  ?Neck:  ?   Comments: No obvious tenderness to the cervical, thoracic or lumbosacral spine ?Cardiovascular:  ?   Rate and Rhythm: Normal rate.  ?   Heart sounds: Normal heart sounds.  ?Pulmonary:  ?   Effort: Pulmonary effort is normal.  ?   Breath sounds: Normal breath sounds.  ?Abdominal:  ?   General: Abdomen is flat.  ?   Palpations: Abdomen is soft.  ?   Tenderness: There is no abdominal tenderness.  ?Musculoskeletal:     ?   General: No tenderness. Normal range of motion.  ?   Cervical back: No tenderness.  ?   Right lower leg: No edema.  ?   Left lower leg: No edema.  ?   Comments: There is a small superficial abrasion to her left middle finger.  No underlying bony tenderness.  No other pain on palpation or range of motion of the extremities, including the hips.  She does have a healed suture line to her right hip area.  ?Skin: ?   Comments: She has a large scab on her upper  back from a prior skin lesion removal.  No signs of infection.  No bleeding.  ?Neurological:  ?   General: No focal deficit present.  ?   Mental Status: She is alert.  ?   Comments: Oriented to person and place  ? ? ?ED Results / Procedures / Treatments   ?Labs ?(all labs ordered are listed, but only abnormal results are displayed) ?Labs Reviewed - No data to display ? ?EKG ?None ? ?Radiology ?CT Head Wo Contrast ? ?Result Date: 02/25/2022 ?CLINICAL DATA:  Facial trauma, blunt; Head trauma, minor (Age >= 65y); Neck trauma (Age >= 65y) EXAM: CT HEAD WITHOUT CONTRAST CT MAXILLOFACIAL WITHOUT CONTRAST CT CERVICAL SPINE WITHOUT CONTRAST TECHNIQUE: Multidetector CT imaging of the head, cervical spine, and maxillofacial structures were performed using the standard protocol without intravenous contrast. Multiplanar CT image reconstructions of the cervical spine and maxillofacial structures were also generated. RADIATION DOSE  REDUCTION: This exam was performed according to the departmental dose-optimization program which includes automated exposure control, adjustment of the mA and/or kV according to patient size and/or use of iterative reconstruction technique. COMPARISON:  01/09/2022 FINDINGS: CT HEAD FINDINGS Brain: No evidence of acute infarction, hemorrhage, hydrocephalus, extra-axial collection or mass lesion/mass effect. Extensive low-density changes within the periventricular and subcortical white matter compatible with chronic microvascular ischemic change. Mild-moderate diffuse cerebral volume loss. Vascular: No hyperdense vessel or unexpected calcification. Skull: Normal. Negative for fracture or focal lesion. Other: Negative for scalp hematoma. CT MAXILLOFACIAL FINDINGS Osseous: No acute maxillofacial bone fracture. Bony orbital walls are intact. Mandible intact. Temporomandibular joints are aligned without dislocation. Orbits: Negative. No traumatic or inflammatory finding. Sinuses: Scattered partial left mastoid air cell opacification, unchanged. No air-fluid levels within the paranasal sinuses. Soft tissues: Negative. CT CERVICAL SPINE FINDINGS Alignment: Facet joints are aligned without dislocation or traumatic listhesis. Dens and lateral masses are aligned. Skull base and vertebrae: Subtle superior endplate depressions of the T2 and T3 vertebral bodies within the included upper thoracic spine (series 7, images 36-37). The appearance of the T2 vertebrae is similar to the previous CT of 01/09/2022. The T3 level was not included on that exam, making this level age indeterminate. No evidence of acute fracture of the cervical spine. No primary bone lesion or focal pathologic process. Soft tissues and spinal canal: No prevertebral fluid or swelling. No visible canal hematoma. Disc levels: Degenerative disc disease is most pronounced at the C6-7 level. Multilevel bilateral facet arthropathy, similar to prior. Upper chest:  Included lung apices are clear. Other: None. IMPRESSION: 1. No acute intracranial abnormality. 2. No evidence of acute maxillofacial bone fracture. 3. No evidence of acute cervical spine fracture or subluxation. 4. Subtle superior endplate depressions of the T2 and T3 vertebral bodies within the included upper thoracic spine. The appearance of the T2 vertebrae is similar to the previous CT of 01/09/2022. The T3 level was not included on that exam, making this level age indeterminate. Correlate with point tenderness. Electronically Signed   By: Davina Poke D.O.   On: 02/25/2022 18:35  ? ?CT Cervical Spine Wo Contrast ? ?Result Date: 02/25/2022 ?CLINICAL DATA:  Facial trauma, blunt; Head trauma, minor (Age >= 65y); Neck trauma (Age >= 65y) EXAM: CT HEAD WITHOUT CONTRAST CT MAXILLOFACIAL WITHOUT CONTRAST CT CERVICAL SPINE WITHOUT CONTRAST TECHNIQUE: Multidetector CT imaging of the head, cervical spine, and maxillofacial structures were performed using the standard protocol without intravenous contrast. Multiplanar CT image reconstructions of the cervical spine and maxillofacial structures were also generated. RADIATION DOSE REDUCTION:  This exam was performed according to the departmental dose-optimization program which includes automated exposure control, adjustment of the mA and/or kV according to patient size and/or use of iterative reconstruction technique. COMPARISON:  01/09/2022 FINDINGS: CT HEAD FINDINGS Brain: No evidence of acute infarction, hemorrhage, hydrocephalus, extra-axial collection or mass lesion/mass effect. Extensive low-density changes within the periventricular and subcortical white matter compatible with chronic microvascular ischemic change. Mild-moderate diffuse cerebral volume loss. Vascular: No hyperdense vessel or unexpected calcification. Skull: Normal. Negative for fracture or focal lesion. Other: Negative for scalp hematoma. CT MAXILLOFACIAL FINDINGS Osseous: No acute maxillofacial bone  fracture. Bony orbital walls are intact. Mandible intact. Temporomandibular joints are aligned without dislocation. Orbits: Negative. No traumatic or inflammatory finding. Sinuses: Scattered partial left mastoid air ce

## 2022-02-27 DIAGNOSIS — Z96641 Presence of right artificial hip joint: Secondary | ICD-10-CM | POA: Diagnosis not present

## 2022-02-27 DIAGNOSIS — G309 Alzheimer's disease, unspecified: Secondary | ICD-10-CM | POA: Diagnosis not present

## 2022-02-27 DIAGNOSIS — I5032 Chronic diastolic (congestive) heart failure: Secondary | ICD-10-CM | POA: Diagnosis not present

## 2022-02-27 DIAGNOSIS — N1831 Chronic kidney disease, stage 3a: Secondary | ICD-10-CM | POA: Diagnosis not present

## 2022-02-27 DIAGNOSIS — I130 Hypertensive heart and chronic kidney disease with heart failure and stage 1 through stage 4 chronic kidney disease, or unspecified chronic kidney disease: Secondary | ICD-10-CM | POA: Diagnosis not present

## 2022-02-27 DIAGNOSIS — S72001D Fracture of unspecified part of neck of right femur, subsequent encounter for closed fracture with routine healing: Secondary | ICD-10-CM | POA: Diagnosis not present

## 2022-02-28 DIAGNOSIS — I5032 Chronic diastolic (congestive) heart failure: Secondary | ICD-10-CM | POA: Diagnosis not present

## 2022-02-28 DIAGNOSIS — N1831 Chronic kidney disease, stage 3a: Secondary | ICD-10-CM | POA: Diagnosis not present

## 2022-02-28 DIAGNOSIS — Z96641 Presence of right artificial hip joint: Secondary | ICD-10-CM | POA: Diagnosis not present

## 2022-02-28 DIAGNOSIS — G309 Alzheimer's disease, unspecified: Secondary | ICD-10-CM | POA: Diagnosis not present

## 2022-02-28 DIAGNOSIS — I130 Hypertensive heart and chronic kidney disease with heart failure and stage 1 through stage 4 chronic kidney disease, or unspecified chronic kidney disease: Secondary | ICD-10-CM | POA: Diagnosis not present

## 2022-02-28 DIAGNOSIS — S72001D Fracture of unspecified part of neck of right femur, subsequent encounter for closed fracture with routine healing: Secondary | ICD-10-CM | POA: Diagnosis not present

## 2022-03-02 DIAGNOSIS — N1831 Chronic kidney disease, stage 3a: Secondary | ICD-10-CM | POA: Diagnosis not present

## 2022-03-02 DIAGNOSIS — I130 Hypertensive heart and chronic kidney disease with heart failure and stage 1 through stage 4 chronic kidney disease, or unspecified chronic kidney disease: Secondary | ICD-10-CM | POA: Diagnosis not present

## 2022-03-02 DIAGNOSIS — G309 Alzheimer's disease, unspecified: Secondary | ICD-10-CM | POA: Diagnosis not present

## 2022-03-02 DIAGNOSIS — N390 Urinary tract infection, site not specified: Secondary | ICD-10-CM | POA: Diagnosis not present

## 2022-03-02 DIAGNOSIS — Z96641 Presence of right artificial hip joint: Secondary | ICD-10-CM | POA: Diagnosis not present

## 2022-03-02 DIAGNOSIS — I5032 Chronic diastolic (congestive) heart failure: Secondary | ICD-10-CM | POA: Diagnosis not present

## 2022-03-02 DIAGNOSIS — S72001D Fracture of unspecified part of neck of right femur, subsequent encounter for closed fracture with routine healing: Secondary | ICD-10-CM | POA: Diagnosis not present

## 2022-03-03 DIAGNOSIS — G309 Alzheimer's disease, unspecified: Secondary | ICD-10-CM | POA: Diagnosis not present

## 2022-03-03 DIAGNOSIS — Z96641 Presence of right artificial hip joint: Secondary | ICD-10-CM | POA: Diagnosis not present

## 2022-03-03 DIAGNOSIS — N1831 Chronic kidney disease, stage 3a: Secondary | ICD-10-CM | POA: Diagnosis not present

## 2022-03-03 DIAGNOSIS — I5032 Chronic diastolic (congestive) heart failure: Secondary | ICD-10-CM | POA: Diagnosis not present

## 2022-03-03 DIAGNOSIS — I130 Hypertensive heart and chronic kidney disease with heart failure and stage 1 through stage 4 chronic kidney disease, or unspecified chronic kidney disease: Secondary | ICD-10-CM | POA: Diagnosis not present

## 2022-03-03 DIAGNOSIS — S72001D Fracture of unspecified part of neck of right femur, subsequent encounter for closed fracture with routine healing: Secondary | ICD-10-CM | POA: Diagnosis not present

## 2022-03-07 DIAGNOSIS — I130 Hypertensive heart and chronic kidney disease with heart failure and stage 1 through stage 4 chronic kidney disease, or unspecified chronic kidney disease: Secondary | ICD-10-CM | POA: Diagnosis not present

## 2022-03-07 DIAGNOSIS — G309 Alzheimer's disease, unspecified: Secondary | ICD-10-CM | POA: Diagnosis not present

## 2022-03-07 DIAGNOSIS — I5032 Chronic diastolic (congestive) heart failure: Secondary | ICD-10-CM | POA: Diagnosis not present

## 2022-03-07 DIAGNOSIS — N1831 Chronic kidney disease, stage 3a: Secondary | ICD-10-CM | POA: Diagnosis not present

## 2022-03-07 DIAGNOSIS — Z96641 Presence of right artificial hip joint: Secondary | ICD-10-CM | POA: Diagnosis not present

## 2022-03-07 DIAGNOSIS — S72001D Fracture of unspecified part of neck of right femur, subsequent encounter for closed fracture with routine healing: Secondary | ICD-10-CM | POA: Diagnosis not present

## 2022-03-08 DIAGNOSIS — I130 Hypertensive heart and chronic kidney disease with heart failure and stage 1 through stage 4 chronic kidney disease, or unspecified chronic kidney disease: Secondary | ICD-10-CM | POA: Diagnosis not present

## 2022-03-08 DIAGNOSIS — S72001D Fracture of unspecified part of neck of right femur, subsequent encounter for closed fracture with routine healing: Secondary | ICD-10-CM | POA: Diagnosis not present

## 2022-03-08 DIAGNOSIS — G309 Alzheimer's disease, unspecified: Secondary | ICD-10-CM | POA: Diagnosis not present

## 2022-03-08 DIAGNOSIS — I5032 Chronic diastolic (congestive) heart failure: Secondary | ICD-10-CM | POA: Diagnosis not present

## 2022-03-08 DIAGNOSIS — N1831 Chronic kidney disease, stage 3a: Secondary | ICD-10-CM | POA: Diagnosis not present

## 2022-03-08 DIAGNOSIS — Z96641 Presence of right artificial hip joint: Secondary | ICD-10-CM | POA: Diagnosis not present

## 2022-03-09 DIAGNOSIS — I130 Hypertensive heart and chronic kidney disease with heart failure and stage 1 through stage 4 chronic kidney disease, or unspecified chronic kidney disease: Secondary | ICD-10-CM | POA: Diagnosis not present

## 2022-03-09 DIAGNOSIS — N1831 Chronic kidney disease, stage 3a: Secondary | ICD-10-CM | POA: Diagnosis not present

## 2022-03-09 DIAGNOSIS — I5032 Chronic diastolic (congestive) heart failure: Secondary | ICD-10-CM | POA: Diagnosis not present

## 2022-03-09 DIAGNOSIS — G309 Alzheimer's disease, unspecified: Secondary | ICD-10-CM | POA: Diagnosis not present

## 2022-03-09 DIAGNOSIS — S72001D Fracture of unspecified part of neck of right femur, subsequent encounter for closed fracture with routine healing: Secondary | ICD-10-CM | POA: Diagnosis not present

## 2022-03-09 DIAGNOSIS — Z96641 Presence of right artificial hip joint: Secondary | ICD-10-CM | POA: Diagnosis not present

## 2022-03-09 DIAGNOSIS — I10 Essential (primary) hypertension: Secondary | ICD-10-CM | POA: Diagnosis not present

## 2022-03-13 DIAGNOSIS — Z96641 Presence of right artificial hip joint: Secondary | ICD-10-CM | POA: Diagnosis not present

## 2022-03-13 DIAGNOSIS — N1831 Chronic kidney disease, stage 3a: Secondary | ICD-10-CM | POA: Diagnosis not present

## 2022-03-13 DIAGNOSIS — S72001D Fracture of unspecified part of neck of right femur, subsequent encounter for closed fracture with routine healing: Secondary | ICD-10-CM | POA: Diagnosis not present

## 2022-03-13 DIAGNOSIS — I130 Hypertensive heart and chronic kidney disease with heart failure and stage 1 through stage 4 chronic kidney disease, or unspecified chronic kidney disease: Secondary | ICD-10-CM | POA: Diagnosis not present

## 2022-03-13 DIAGNOSIS — G309 Alzheimer's disease, unspecified: Secondary | ICD-10-CM | POA: Diagnosis not present

## 2022-03-13 DIAGNOSIS — I5032 Chronic diastolic (congestive) heart failure: Secondary | ICD-10-CM | POA: Diagnosis not present

## 2022-03-14 DIAGNOSIS — I5032 Chronic diastolic (congestive) heart failure: Secondary | ICD-10-CM | POA: Diagnosis not present

## 2022-03-14 DIAGNOSIS — N1831 Chronic kidney disease, stage 3a: Secondary | ICD-10-CM | POA: Diagnosis not present

## 2022-03-14 DIAGNOSIS — G309 Alzheimer's disease, unspecified: Secondary | ICD-10-CM | POA: Diagnosis not present

## 2022-03-14 DIAGNOSIS — I130 Hypertensive heart and chronic kidney disease with heart failure and stage 1 through stage 4 chronic kidney disease, or unspecified chronic kidney disease: Secondary | ICD-10-CM | POA: Diagnosis not present

## 2022-03-14 DIAGNOSIS — S72001D Fracture of unspecified part of neck of right femur, subsequent encounter for closed fracture with routine healing: Secondary | ICD-10-CM | POA: Diagnosis not present

## 2022-03-14 DIAGNOSIS — Z96641 Presence of right artificial hip joint: Secondary | ICD-10-CM | POA: Diagnosis not present

## 2022-03-15 DIAGNOSIS — G309 Alzheimer's disease, unspecified: Secondary | ICD-10-CM | POA: Diagnosis not present

## 2022-03-15 DIAGNOSIS — I130 Hypertensive heart and chronic kidney disease with heart failure and stage 1 through stage 4 chronic kidney disease, or unspecified chronic kidney disease: Secondary | ICD-10-CM | POA: Diagnosis not present

## 2022-03-15 DIAGNOSIS — S72001D Fracture of unspecified part of neck of right femur, subsequent encounter for closed fracture with routine healing: Secondary | ICD-10-CM | POA: Diagnosis not present

## 2022-03-15 DIAGNOSIS — N1831 Chronic kidney disease, stage 3a: Secondary | ICD-10-CM | POA: Diagnosis not present

## 2022-03-15 DIAGNOSIS — Z96641 Presence of right artificial hip joint: Secondary | ICD-10-CM | POA: Diagnosis not present

## 2022-03-15 DIAGNOSIS — I5032 Chronic diastolic (congestive) heart failure: Secondary | ICD-10-CM | POA: Diagnosis not present

## 2022-03-16 DIAGNOSIS — S72001D Fracture of unspecified part of neck of right femur, subsequent encounter for closed fracture with routine healing: Secondary | ICD-10-CM | POA: Diagnosis not present

## 2022-03-16 DIAGNOSIS — Z96641 Presence of right artificial hip joint: Secondary | ICD-10-CM | POA: Diagnosis not present

## 2022-03-16 DIAGNOSIS — I5032 Chronic diastolic (congestive) heart failure: Secondary | ICD-10-CM | POA: Diagnosis not present

## 2022-03-16 DIAGNOSIS — G309 Alzheimer's disease, unspecified: Secondary | ICD-10-CM | POA: Diagnosis not present

## 2022-03-16 DIAGNOSIS — N1831 Chronic kidney disease, stage 3a: Secondary | ICD-10-CM | POA: Diagnosis not present

## 2022-03-16 DIAGNOSIS — I130 Hypertensive heart and chronic kidney disease with heart failure and stage 1 through stage 4 chronic kidney disease, or unspecified chronic kidney disease: Secondary | ICD-10-CM | POA: Diagnosis not present

## 2022-03-19 DIAGNOSIS — S72001D Fracture of unspecified part of neck of right femur, subsequent encounter for closed fracture with routine healing: Secondary | ICD-10-CM | POA: Diagnosis not present

## 2022-03-19 DIAGNOSIS — I130 Hypertensive heart and chronic kidney disease with heart failure and stage 1 through stage 4 chronic kidney disease, or unspecified chronic kidney disease: Secondary | ICD-10-CM | POA: Diagnosis not present

## 2022-03-19 DIAGNOSIS — I672 Cerebral atherosclerosis: Secondary | ICD-10-CM | POA: Diagnosis not present

## 2022-03-19 DIAGNOSIS — D649 Anemia, unspecified: Secondary | ICD-10-CM | POA: Diagnosis not present

## 2022-03-19 DIAGNOSIS — G309 Alzheimer's disease, unspecified: Secondary | ICD-10-CM | POA: Diagnosis not present

## 2022-03-19 DIAGNOSIS — E039 Hypothyroidism, unspecified: Secondary | ICD-10-CM | POA: Diagnosis not present

## 2022-03-19 DIAGNOSIS — Z556 Problems related to health literacy: Secondary | ICD-10-CM | POA: Diagnosis not present

## 2022-03-19 DIAGNOSIS — I5032 Chronic diastolic (congestive) heart failure: Secondary | ICD-10-CM | POA: Diagnosis not present

## 2022-03-19 DIAGNOSIS — H548 Legal blindness, as defined in USA: Secondary | ICD-10-CM | POA: Diagnosis not present

## 2022-03-19 DIAGNOSIS — F02B0 Dementia in other diseases classified elsewhere, moderate, without behavioral disturbance, psychotic disturbance, mood disturbance, and anxiety: Secondary | ICD-10-CM | POA: Diagnosis not present

## 2022-03-19 DIAGNOSIS — Z96641 Presence of right artificial hip joint: Secondary | ICD-10-CM | POA: Diagnosis not present

## 2022-03-19 DIAGNOSIS — N1831 Chronic kidney disease, stage 3a: Secondary | ICD-10-CM | POA: Diagnosis not present

## 2022-03-19 DIAGNOSIS — Z9181 History of falling: Secondary | ICD-10-CM | POA: Diagnosis not present

## 2022-03-19 DIAGNOSIS — Z22322 Carrier or suspected carrier of Methicillin resistant Staphylococcus aureus: Secondary | ICD-10-CM | POA: Diagnosis not present

## 2022-03-19 DIAGNOSIS — Z5982 Transportation insecurity: Secondary | ICD-10-CM | POA: Diagnosis not present

## 2022-03-21 DIAGNOSIS — G309 Alzheimer's disease, unspecified: Secondary | ICD-10-CM | POA: Diagnosis not present

## 2022-03-21 DIAGNOSIS — I5032 Chronic diastolic (congestive) heart failure: Secondary | ICD-10-CM | POA: Diagnosis not present

## 2022-03-21 DIAGNOSIS — Z96641 Presence of right artificial hip joint: Secondary | ICD-10-CM | POA: Diagnosis not present

## 2022-03-21 DIAGNOSIS — I130 Hypertensive heart and chronic kidney disease with heart failure and stage 1 through stage 4 chronic kidney disease, or unspecified chronic kidney disease: Secondary | ICD-10-CM | POA: Diagnosis not present

## 2022-03-21 DIAGNOSIS — N1831 Chronic kidney disease, stage 3a: Secondary | ICD-10-CM | POA: Diagnosis not present

## 2022-03-21 DIAGNOSIS — S72001D Fracture of unspecified part of neck of right femur, subsequent encounter for closed fracture with routine healing: Secondary | ICD-10-CM | POA: Diagnosis not present

## 2022-03-22 DIAGNOSIS — L02222 Furuncle of back [any part, except buttock]: Secondary | ICD-10-CM | POA: Diagnosis not present

## 2022-03-22 DIAGNOSIS — B9689 Other specified bacterial agents as the cause of diseases classified elsewhere: Secondary | ICD-10-CM | POA: Diagnosis not present

## 2022-03-27 DIAGNOSIS — I5032 Chronic diastolic (congestive) heart failure: Secondary | ICD-10-CM | POA: Diagnosis not present

## 2022-03-27 DIAGNOSIS — N1831 Chronic kidney disease, stage 3a: Secondary | ICD-10-CM | POA: Diagnosis not present

## 2022-03-27 DIAGNOSIS — I130 Hypertensive heart and chronic kidney disease with heart failure and stage 1 through stage 4 chronic kidney disease, or unspecified chronic kidney disease: Secondary | ICD-10-CM | POA: Diagnosis not present

## 2022-03-27 DIAGNOSIS — G309 Alzheimer's disease, unspecified: Secondary | ICD-10-CM | POA: Diagnosis not present

## 2022-03-27 DIAGNOSIS — S72001D Fracture of unspecified part of neck of right femur, subsequent encounter for closed fracture with routine healing: Secondary | ICD-10-CM | POA: Diagnosis not present

## 2022-03-27 DIAGNOSIS — Z96641 Presence of right artificial hip joint: Secondary | ICD-10-CM | POA: Diagnosis not present

## 2022-03-28 DIAGNOSIS — N1831 Chronic kidney disease, stage 3a: Secondary | ICD-10-CM | POA: Diagnosis not present

## 2022-03-28 DIAGNOSIS — Z96641 Presence of right artificial hip joint: Secondary | ICD-10-CM | POA: Diagnosis not present

## 2022-03-28 DIAGNOSIS — I130 Hypertensive heart and chronic kidney disease with heart failure and stage 1 through stage 4 chronic kidney disease, or unspecified chronic kidney disease: Secondary | ICD-10-CM | POA: Diagnosis not present

## 2022-03-28 DIAGNOSIS — G309 Alzheimer's disease, unspecified: Secondary | ICD-10-CM | POA: Diagnosis not present

## 2022-03-28 DIAGNOSIS — I5032 Chronic diastolic (congestive) heart failure: Secondary | ICD-10-CM | POA: Diagnosis not present

## 2022-03-28 DIAGNOSIS — S72001D Fracture of unspecified part of neck of right femur, subsequent encounter for closed fracture with routine healing: Secondary | ICD-10-CM | POA: Diagnosis not present

## 2022-03-31 DIAGNOSIS — G309 Alzheimer's disease, unspecified: Secondary | ICD-10-CM | POA: Diagnosis not present

## 2022-03-31 DIAGNOSIS — N1831 Chronic kidney disease, stage 3a: Secondary | ICD-10-CM | POA: Diagnosis not present

## 2022-03-31 DIAGNOSIS — S72001D Fracture of unspecified part of neck of right femur, subsequent encounter for closed fracture with routine healing: Secondary | ICD-10-CM | POA: Diagnosis not present

## 2022-03-31 DIAGNOSIS — I130 Hypertensive heart and chronic kidney disease with heart failure and stage 1 through stage 4 chronic kidney disease, or unspecified chronic kidney disease: Secondary | ICD-10-CM | POA: Diagnosis not present

## 2022-03-31 DIAGNOSIS — I5032 Chronic diastolic (congestive) heart failure: Secondary | ICD-10-CM | POA: Diagnosis not present

## 2022-03-31 DIAGNOSIS — Z96641 Presence of right artificial hip joint: Secondary | ICD-10-CM | POA: Diagnosis not present

## 2022-04-06 DIAGNOSIS — N1831 Chronic kidney disease, stage 3a: Secondary | ICD-10-CM | POA: Diagnosis not present

## 2022-04-06 DIAGNOSIS — K219 Gastro-esophageal reflux disease without esophagitis: Secondary | ICD-10-CM | POA: Diagnosis not present

## 2022-04-06 DIAGNOSIS — R269 Unspecified abnormalities of gait and mobility: Secondary | ICD-10-CM | POA: Diagnosis not present

## 2022-04-06 DIAGNOSIS — G309 Alzheimer's disease, unspecified: Secondary | ICD-10-CM | POA: Diagnosis not present

## 2022-04-06 DIAGNOSIS — I130 Hypertensive heart and chronic kidney disease with heart failure and stage 1 through stage 4 chronic kidney disease, or unspecified chronic kidney disease: Secondary | ICD-10-CM | POA: Diagnosis not present

## 2022-04-06 DIAGNOSIS — I5032 Chronic diastolic (congestive) heart failure: Secondary | ICD-10-CM | POA: Diagnosis not present

## 2022-04-06 DIAGNOSIS — S72001D Fracture of unspecified part of neck of right femur, subsequent encounter for closed fracture with routine healing: Secondary | ICD-10-CM | POA: Diagnosis not present

## 2022-04-06 DIAGNOSIS — Z96641 Presence of right artificial hip joint: Secondary | ICD-10-CM | POA: Diagnosis not present

## 2022-04-06 DIAGNOSIS — I10 Essential (primary) hypertension: Secondary | ICD-10-CM | POA: Diagnosis not present

## 2022-04-06 DIAGNOSIS — F419 Anxiety disorder, unspecified: Secondary | ICD-10-CM | POA: Diagnosis not present

## 2022-04-10 DIAGNOSIS — S72001D Fracture of unspecified part of neck of right femur, subsequent encounter for closed fracture with routine healing: Secondary | ICD-10-CM | POA: Diagnosis not present

## 2022-04-10 DIAGNOSIS — G309 Alzheimer's disease, unspecified: Secondary | ICD-10-CM | POA: Diagnosis not present

## 2022-04-10 DIAGNOSIS — I5032 Chronic diastolic (congestive) heart failure: Secondary | ICD-10-CM | POA: Diagnosis not present

## 2022-04-10 DIAGNOSIS — I130 Hypertensive heart and chronic kidney disease with heart failure and stage 1 through stage 4 chronic kidney disease, or unspecified chronic kidney disease: Secondary | ICD-10-CM | POA: Diagnosis not present

## 2022-04-10 DIAGNOSIS — Z96641 Presence of right artificial hip joint: Secondary | ICD-10-CM | POA: Diagnosis not present

## 2022-04-10 DIAGNOSIS — N1831 Chronic kidney disease, stage 3a: Secondary | ICD-10-CM | POA: Diagnosis not present

## 2022-04-11 DIAGNOSIS — I130 Hypertensive heart and chronic kidney disease with heart failure and stage 1 through stage 4 chronic kidney disease, or unspecified chronic kidney disease: Secondary | ICD-10-CM | POA: Diagnosis not present

## 2022-04-11 DIAGNOSIS — G309 Alzheimer's disease, unspecified: Secondary | ICD-10-CM | POA: Diagnosis not present

## 2022-04-11 DIAGNOSIS — Z96641 Presence of right artificial hip joint: Secondary | ICD-10-CM | POA: Diagnosis not present

## 2022-04-11 DIAGNOSIS — N1831 Chronic kidney disease, stage 3a: Secondary | ICD-10-CM | POA: Diagnosis not present

## 2022-04-11 DIAGNOSIS — I5032 Chronic diastolic (congestive) heart failure: Secondary | ICD-10-CM | POA: Diagnosis not present

## 2022-04-11 DIAGNOSIS — S72001D Fracture of unspecified part of neck of right femur, subsequent encounter for closed fracture with routine healing: Secondary | ICD-10-CM | POA: Diagnosis not present

## 2022-04-12 DIAGNOSIS — Z96641 Presence of right artificial hip joint: Secondary | ICD-10-CM | POA: Diagnosis not present

## 2022-04-12 DIAGNOSIS — I130 Hypertensive heart and chronic kidney disease with heart failure and stage 1 through stage 4 chronic kidney disease, or unspecified chronic kidney disease: Secondary | ICD-10-CM | POA: Diagnosis not present

## 2022-04-12 DIAGNOSIS — N1831 Chronic kidney disease, stage 3a: Secondary | ICD-10-CM | POA: Diagnosis not present

## 2022-04-12 DIAGNOSIS — G309 Alzheimer's disease, unspecified: Secondary | ICD-10-CM | POA: Diagnosis not present

## 2022-04-12 DIAGNOSIS — S72001D Fracture of unspecified part of neck of right femur, subsequent encounter for closed fracture with routine healing: Secondary | ICD-10-CM | POA: Diagnosis not present

## 2022-04-12 DIAGNOSIS — I5032 Chronic diastolic (congestive) heart failure: Secondary | ICD-10-CM | POA: Diagnosis not present

## 2022-04-14 DIAGNOSIS — Z96641 Presence of right artificial hip joint: Secondary | ICD-10-CM | POA: Diagnosis not present

## 2022-04-14 DIAGNOSIS — N1831 Chronic kidney disease, stage 3a: Secondary | ICD-10-CM | POA: Diagnosis not present

## 2022-04-14 DIAGNOSIS — G309 Alzheimer's disease, unspecified: Secondary | ICD-10-CM | POA: Diagnosis not present

## 2022-04-14 DIAGNOSIS — S72001D Fracture of unspecified part of neck of right femur, subsequent encounter for closed fracture with routine healing: Secondary | ICD-10-CM | POA: Diagnosis not present

## 2022-04-14 DIAGNOSIS — I5032 Chronic diastolic (congestive) heart failure: Secondary | ICD-10-CM | POA: Diagnosis not present

## 2022-04-14 DIAGNOSIS — I130 Hypertensive heart and chronic kidney disease with heart failure and stage 1 through stage 4 chronic kidney disease, or unspecified chronic kidney disease: Secondary | ICD-10-CM | POA: Diagnosis not present

## 2022-04-17 DIAGNOSIS — N1831 Chronic kidney disease, stage 3a: Secondary | ICD-10-CM | POA: Diagnosis not present

## 2022-04-17 DIAGNOSIS — G309 Alzheimer's disease, unspecified: Secondary | ICD-10-CM | POA: Diagnosis not present

## 2022-04-17 DIAGNOSIS — Z96641 Presence of right artificial hip joint: Secondary | ICD-10-CM | POA: Diagnosis not present

## 2022-04-17 DIAGNOSIS — I5032 Chronic diastolic (congestive) heart failure: Secondary | ICD-10-CM | POA: Diagnosis not present

## 2022-04-17 DIAGNOSIS — I130 Hypertensive heart and chronic kidney disease with heart failure and stage 1 through stage 4 chronic kidney disease, or unspecified chronic kidney disease: Secondary | ICD-10-CM | POA: Diagnosis not present

## 2022-04-17 DIAGNOSIS — S72001D Fracture of unspecified part of neck of right femur, subsequent encounter for closed fracture with routine healing: Secondary | ICD-10-CM | POA: Diagnosis not present

## 2022-04-18 DIAGNOSIS — C44729 Squamous cell carcinoma of skin of left lower limb, including hip: Secondary | ICD-10-CM | POA: Diagnosis not present

## 2022-04-18 DIAGNOSIS — B9689 Other specified bacterial agents as the cause of diseases classified elsewhere: Secondary | ICD-10-CM | POA: Diagnosis not present

## 2022-04-18 DIAGNOSIS — L02424 Furuncle of left upper limb: Secondary | ICD-10-CM | POA: Diagnosis not present

## 2022-04-18 DIAGNOSIS — E039 Hypothyroidism, unspecified: Secondary | ICD-10-CM | POA: Diagnosis not present

## 2022-04-18 DIAGNOSIS — Z9181 History of falling: Secondary | ICD-10-CM | POA: Diagnosis not present

## 2022-04-18 DIAGNOSIS — L02222 Furuncle of back [any part, except buttock]: Secondary | ICD-10-CM | POA: Diagnosis not present

## 2022-04-18 DIAGNOSIS — D631 Anemia in chronic kidney disease: Secondary | ICD-10-CM | POA: Diagnosis not present

## 2022-04-18 DIAGNOSIS — S72001D Fracture of unspecified part of neck of right femur, subsequent encounter for closed fracture with routine healing: Secondary | ICD-10-CM | POA: Diagnosis not present

## 2022-04-18 DIAGNOSIS — C44622 Squamous cell carcinoma of skin of right upper limb, including shoulder: Secondary | ICD-10-CM | POA: Diagnosis not present

## 2022-04-18 DIAGNOSIS — F02B0 Dementia in other diseases classified elsewhere, moderate, without behavioral disturbance, psychotic disturbance, mood disturbance, and anxiety: Secondary | ICD-10-CM | POA: Diagnosis not present

## 2022-04-18 DIAGNOSIS — I130 Hypertensive heart and chronic kidney disease with heart failure and stage 1 through stage 4 chronic kidney disease, or unspecified chronic kidney disease: Secondary | ICD-10-CM | POA: Diagnosis not present

## 2022-04-18 DIAGNOSIS — Z22322 Carrier or suspected carrier of Methicillin resistant Staphylococcus aureus: Secondary | ICD-10-CM | POA: Diagnosis not present

## 2022-04-18 DIAGNOSIS — G309 Alzheimer's disease, unspecified: Secondary | ICD-10-CM | POA: Diagnosis not present

## 2022-04-18 DIAGNOSIS — I672 Cerebral atherosclerosis: Secondary | ICD-10-CM | POA: Diagnosis not present

## 2022-04-18 DIAGNOSIS — I5032 Chronic diastolic (congestive) heart failure: Secondary | ICD-10-CM | POA: Diagnosis not present

## 2022-04-18 DIAGNOSIS — H548 Legal blindness, as defined in USA: Secondary | ICD-10-CM | POA: Diagnosis not present

## 2022-04-18 DIAGNOSIS — Z556 Problems related to health literacy: Secondary | ICD-10-CM | POA: Diagnosis not present

## 2022-04-18 DIAGNOSIS — N1831 Chronic kidney disease, stage 3a: Secondary | ICD-10-CM | POA: Diagnosis not present

## 2022-04-18 DIAGNOSIS — Z5982 Transportation insecurity: Secondary | ICD-10-CM | POA: Diagnosis not present

## 2022-04-18 DIAGNOSIS — Z96641 Presence of right artificial hip joint: Secondary | ICD-10-CM | POA: Diagnosis not present

## 2022-04-19 DIAGNOSIS — S72001D Fracture of unspecified part of neck of right femur, subsequent encounter for closed fracture with routine healing: Secondary | ICD-10-CM | POA: Diagnosis not present

## 2022-04-19 DIAGNOSIS — I5032 Chronic diastolic (congestive) heart failure: Secondary | ICD-10-CM | POA: Diagnosis not present

## 2022-04-19 DIAGNOSIS — Z96641 Presence of right artificial hip joint: Secondary | ICD-10-CM | POA: Diagnosis not present

## 2022-04-19 DIAGNOSIS — L02424 Furuncle of left upper limb: Secondary | ICD-10-CM | POA: Diagnosis not present

## 2022-04-19 DIAGNOSIS — I130 Hypertensive heart and chronic kidney disease with heart failure and stage 1 through stage 4 chronic kidney disease, or unspecified chronic kidney disease: Secondary | ICD-10-CM | POA: Diagnosis not present

## 2022-04-19 DIAGNOSIS — N1831 Chronic kidney disease, stage 3a: Secondary | ICD-10-CM | POA: Diagnosis not present

## 2022-04-21 DIAGNOSIS — Z96641 Presence of right artificial hip joint: Secondary | ICD-10-CM | POA: Diagnosis not present

## 2022-04-21 DIAGNOSIS — I130 Hypertensive heart and chronic kidney disease with heart failure and stage 1 through stage 4 chronic kidney disease, or unspecified chronic kidney disease: Secondary | ICD-10-CM | POA: Diagnosis not present

## 2022-04-21 DIAGNOSIS — I5032 Chronic diastolic (congestive) heart failure: Secondary | ICD-10-CM | POA: Diagnosis not present

## 2022-04-21 DIAGNOSIS — S72001D Fracture of unspecified part of neck of right femur, subsequent encounter for closed fracture with routine healing: Secondary | ICD-10-CM | POA: Diagnosis not present

## 2022-04-21 DIAGNOSIS — N1831 Chronic kidney disease, stage 3a: Secondary | ICD-10-CM | POA: Diagnosis not present

## 2022-04-21 DIAGNOSIS — L02424 Furuncle of left upper limb: Secondary | ICD-10-CM | POA: Diagnosis not present

## 2022-04-24 ENCOUNTER — Emergency Department (HOSPITAL_COMMUNITY)
Admission: EM | Admit: 2022-04-24 | Discharge: 2022-04-25 | Disposition: A | Discharge status: Home / Self Care | Payer: Medicare Other | Attending: Emergency Medicine | Admitting: Emergency Medicine

## 2022-04-24 ENCOUNTER — Emergency Department (HOSPITAL_COMMUNITY): Payer: Medicare Other

## 2022-04-24 DIAGNOSIS — S4990XA Unspecified injury of shoulder and upper arm, unspecified arm, initial encounter: Secondary | ICD-10-CM | POA: Diagnosis present

## 2022-04-24 DIAGNOSIS — W1830XA Fall on same level, unspecified, initial encounter: Secondary | ICD-10-CM | POA: Insufficient documentation

## 2022-04-24 DIAGNOSIS — S299XXA Unspecified injury of thorax, initial encounter: Secondary | ICD-10-CM | POA: Diagnosis not present

## 2022-04-24 DIAGNOSIS — F0390 Unspecified dementia without behavioral disturbance: Secondary | ICD-10-CM | POA: Diagnosis not present

## 2022-04-24 DIAGNOSIS — I719 Aortic aneurysm of unspecified site, without rupture: Secondary | ICD-10-CM | POA: Insufficient documentation

## 2022-04-24 DIAGNOSIS — S41112A Laceration without foreign body of left upper arm, initial encounter: Secondary | ICD-10-CM | POA: Diagnosis not present

## 2022-04-24 DIAGNOSIS — M25572 Pain in left ankle and joints of left foot: Secondary | ICD-10-CM | POA: Diagnosis not present

## 2022-04-24 DIAGNOSIS — Z96641 Presence of right artificial hip joint: Secondary | ICD-10-CM | POA: Diagnosis not present

## 2022-04-24 DIAGNOSIS — M25551 Pain in right hip: Secondary | ICD-10-CM | POA: Insufficient documentation

## 2022-04-24 DIAGNOSIS — L02424 Furuncle of left upper limb: Secondary | ICD-10-CM | POA: Diagnosis not present

## 2022-04-24 DIAGNOSIS — Y92007 Garden or yard of unspecified non-institutional (private) residence as the place of occurrence of the external cause: Secondary | ICD-10-CM | POA: Diagnosis not present

## 2022-04-24 DIAGNOSIS — S41119A Laceration without foreign body of unspecified upper arm, initial encounter: Secondary | ICD-10-CM | POA: Insufficient documentation

## 2022-04-24 DIAGNOSIS — W19XXXA Unspecified fall, initial encounter: Secondary | ICD-10-CM

## 2022-04-24 DIAGNOSIS — T148XXA Other injury of unspecified body region, initial encounter: Secondary | ICD-10-CM

## 2022-04-24 DIAGNOSIS — S72001D Fracture of unspecified part of neck of right femur, subsequent encounter for closed fracture with routine healing: Secondary | ICD-10-CM | POA: Diagnosis not present

## 2022-04-24 DIAGNOSIS — S0990XA Unspecified injury of head, initial encounter: Secondary | ICD-10-CM | POA: Diagnosis not present

## 2022-04-24 DIAGNOSIS — I5032 Chronic diastolic (congestive) heart failure: Secondary | ICD-10-CM | POA: Diagnosis not present

## 2022-04-24 DIAGNOSIS — S41111A Laceration without foreign body of right upper arm, initial encounter: Secondary | ICD-10-CM | POA: Diagnosis not present

## 2022-04-24 DIAGNOSIS — I130 Hypertensive heart and chronic kidney disease with heart failure and stage 1 through stage 4 chronic kidney disease, or unspecified chronic kidney disease: Secondary | ICD-10-CM | POA: Diagnosis not present

## 2022-04-24 DIAGNOSIS — R918 Other nonspecific abnormal finding of lung field: Secondary | ICD-10-CM | POA: Diagnosis not present

## 2022-04-24 DIAGNOSIS — R410 Disorientation, unspecified: Secondary | ICD-10-CM | POA: Diagnosis not present

## 2022-04-24 DIAGNOSIS — S199XXA Unspecified injury of neck, initial encounter: Secondary | ICD-10-CM | POA: Diagnosis not present

## 2022-04-24 DIAGNOSIS — I700 Atherosclerosis of aorta: Secondary | ICD-10-CM | POA: Diagnosis not present

## 2022-04-24 DIAGNOSIS — N1831 Chronic kidney disease, stage 3a: Secondary | ICD-10-CM | POA: Diagnosis not present

## 2022-04-24 MED ORDER — HYDROXYZINE HCL 25 MG PO TABS
25.0000 mg | ORAL_TABLET | Freq: Once | ORAL | Status: AC
  Administered 2022-04-24: 25 mg via ORAL
  Filled 2022-04-24: qty 1

## 2022-04-24 NOTE — ED Provider Notes (Signed)
[facility] DEPT Provider Note   CSN: [id number] Arrival date & time: 04/24/22  1324     History  Chief Complaint  Patient presents with   Lytle Michaels    Karen Pearson is a 88 y.o. female.   Fall    88 year old female with medical history significant for dementia who presents to the emergency department after 2 falls the most recent of which was yesterday.  The patient's history was limited due to her dementia and was supplemented by her husband who is bedside.  He states that he still wishes to continue caring for her in the home and has no desire to escalate her care to a nursing facility.  He states that she wandered into a neighbor's yard yesterday and likely fell into a rose bush and sustained skin tears to her arms bilaterally.  Her last tetanus was in 2017.  She complained of pain in the right hip but has been ambulatory since the fall.  No clear head trauma or loss of consciousness.  She is at her baseline mental status for dementia which waxes and wanes per her husband.  She arrived GCS 14, ABC intact.  Home Medications Prior to Admission medications   Medication Sig Start Date End Date Taking? Authorizing Provider  HYDROcodone-acetaminophen (NORCO/VICODIN) 5-325 MG tablet Take 1-2 tablets by mouth every 4 (four) hours as needed for moderate pain (pain score 4-6). 01/12/22   Dessa Phi, DO  hydrOXYzine (VISTARIL) 25 MG capsule Take 25 mg by mouth at bedtime. 12/26/21   [provider]  VITAMIN E PO Take 1 capsule by mouth daily.    [provider]      Allergies    Acetaminophen    Review of Systems   Review of Systems  Unable to perform ROS: Dementia    Physical Exam Updated Vital Signs BP (!) 176/80   Pulse 78   Temp 98 F (36.7 C) (Oral)   Resp 18   SpO2 100%  Physical Exam Vitals and nursing note reviewed.  Constitutional:      General: She is not in acute distress.    Appearance: She is well-developed.      Comments: GCS 14, ABC intact, pleasantly demented  HENT:     Head: Normocephalic and atraumatic.  Eyes:     Extraocular Movements: Extraocular movements intact.     Conjunctiva/sclera: Conjunctivae normal.     Pupils: Pupils are equal, round, and reactive to light.  Neck:     Comments: No midline tenderness to palpation of the cervical spine.  Range of motion intact Cardiovascular:     Rate and Rhythm: Normal rate and regular rhythm.  Pulmonary:     Effort: Pulmonary effort is normal. No respiratory distress.     Breath sounds: Normal breath sounds.  Chest:     Comments: Clavicles stable nontender to AP compression.  Chest wall stable and nontender to AP and lateral compression. Abdominal:     Palpations: Abdomen is soft.     Tenderness: There is no abdominal tenderness.  Musculoskeletal:     Cervical back: Neck supple.     Comments: No midline tenderness to palpation of the thoracic or lumbar spine.  Skin tears to the arms bilaterally, hemostatic, no other acute traumatic sign of injury  Skin:    General: Skin is warm and dry.  Neurological:     Mental Status: She is alert.     Comments: Cranial nerves II through XII grossly intact.  Moving  all 4 extremities spontaneously.  Sensation grossly intact all 4 extremities     ED Results / Procedures / Treatments   Labs (all labs ordered are listed, but only abnormal results are displayed) Labs Reviewed - No data to display  EKG EKG Interpretation  Date/Time:  Monday April 24 2022 15:54:44 EDT Ventricular Rate:  62 PR Interval:    QRS Duration: 95 QT Interval:  448 QTC Calculation: 455 R Axis:   120 Text Interpretation: Sinus rhythm Right axis deviation Confirmed by Regan Lemming ([id number]) on 04/25/2022 10:09:11 AM  Radiology CT HEAD WO CONTRAST  Result Date: 04/24/2022 CLINICAL DATA:  Head trauma, moderate-severe; Polytrauma, blunt EXAM: CT HEAD WITHOUT CONTRAST CT CERVICAL SPINE WITHOUT CONTRAST TECHNIQUE: Multidetector CT  imaging of the head and cervical spine was performed following the standard protocol without intravenous contrast. Multiplanar CT image reconstructions of the cervical spine were also generated. RADIATION DOSE REDUCTION: This exam was performed according to the departmental dose-optimization program which includes automated exposure control, adjustment of the mA and/or kV according to patient size and/or use of iterative reconstruction technique. COMPARISON:  CT head and C-spine 02/25/2022 FINDINGS: CT HEAD FINDINGS BRAIN: BRAIN Cerebral ventricle sizes are concordant with the degree of cerebral volume loss. Patchy and confluent areas of decreased attenuation are noted throughout the deep and periventricular white matter of the cerebral hemispheres bilaterally, compatible with chronic microvascular ischemic disease. No evidence of large-territorial acute infarction. No parenchymal hemorrhage. No mass lesion. No extra-axial collection. No mass effect or midline shift. No hydrocephalus. Basilar cisterns are patent. Vascular: No hyperdense vessel. Skull: No acute fracture or focal lesion. Sinuses/Orbits: Paranasal sinuses and mastoid air cells are clear. Bilateral lens replacement. Otherwise the orbits are unremarkable. Other: None. CT CERVICAL SPINE FINDINGS Alignment: Normal. Skull base and vertebrae: Multilevel moderate degenerative changes of the spine. Intervertebral disc space vacuum phenomenon at the C6-C7 levels. No acute fracture. No aggressive appearing focal osseous lesion or focal pathologic process. Soft tissues and spinal canal: No prevertebral fluid or swelling. No visible canal hematoma. Upper chest: 4 mm right upper lobe pulmonary nodule. 2 mm left apical pulmonary nodule (4:73). Other: Atherosclerotic plaque of the aortic arch. IMPRESSION: 1. No acute intracranial abnormality. 2. No acute displaced fracture or traumatic listhesis of the cervical spine. 3. Couple bilateral upper lobe pulmonary  micronodules. No follow-up needed if patient is low-risk (and has no known or suspected primary neoplasm). Non-contrast chest CT can be considered in 12 months if patient is high-risk. This recommendation follows the consensus statement: Guidelines for Management of Incidental Pulmonary Nodules Detected on CT Images: From the Fleischner Society 2017; Radiology 2017; 284:228-243. 4.  Aortic aneurysm NOS (ICD10-I71.9). Electronically Signed   By: Iven Finn M.D.   On: 04/24/2022 15:25   CT CERVICAL SPINE WO CONTRAST  Result Date: 04/24/2022 CLINICAL DATA:  Head trauma, moderate-severe; Polytrauma, blunt EXAM: CT HEAD WITHOUT CONTRAST CT CERVICAL SPINE WITHOUT CONTRAST TECHNIQUE: Multidetector CT imaging of the head and cervical spine was performed following the standard protocol without intravenous contrast. Multiplanar CT image reconstructions of the cervical spine were also generated. RADIATION DOSE REDUCTION: This exam was performed according to the departmental dose-optimization program which includes automated exposure control, adjustment of the mA and/or kV according to patient size and/or use of iterative reconstruction technique. COMPARISON:  CT head and C-spine 02/25/2022 FINDINGS: CT HEAD FINDINGS BRAIN: BRAIN Cerebral ventricle sizes are concordant with the degree of cerebral volume loss. Patchy and confluent areas of decreased attenuation are noted throughout  the deep and periventricular white matter of the cerebral hemispheres bilaterally, compatible with chronic microvascular ischemic disease. No evidence of large-territorial acute infarction. No parenchymal hemorrhage. No mass lesion. No extra-axial collection. No mass effect or midline shift. No hydrocephalus. Basilar cisterns are patent. Vascular: No hyperdense vessel. Skull: No acute fracture or focal lesion. Sinuses/Orbits: Paranasal sinuses and mastoid air cells are clear. Bilateral lens replacement. Otherwise the orbits are unremarkable.  Other: None. CT CERVICAL SPINE FINDINGS Alignment: Normal. Skull base and vertebrae: Multilevel moderate degenerative changes of the spine. Intervertebral disc space vacuum phenomenon at the C6-C7 levels. No acute fracture. No aggressive appearing focal osseous lesion or focal pathologic process. Soft tissues and spinal canal: No prevertebral fluid or swelling. No visible canal hematoma. Upper chest: 4 mm right upper lobe pulmonary nodule. 2 mm left apical pulmonary nodule (4:73). Other: Atherosclerotic plaque of the aortic arch. IMPRESSION: 1. No acute intracranial abnormality. 2. No acute displaced fracture or traumatic listhesis of the cervical spine. 3. Couple bilateral upper lobe pulmonary micronodules. No follow-up needed if patient is low-risk (and has no known or suspected primary neoplasm). Non-contrast chest CT can be considered in 12 months if patient is high-risk. This recommendation follows the consensus statement: Guidelines for Management of Incidental Pulmonary Nodules Detected on CT Images: From the Fleischner Society 2017; Radiology 2017; 284:228-243. 4.  Aortic aneurysm NOS (ICD10-I71.9). Electronically Signed   By: Iven Finn M.D.   On: 04/24/2022 15:25   DG Hip Unilat W or Wo Pelvis 2-3 Views Right  Result Date: 04/24/2022 CLINICAL DATA:  A 88 year old female presents following fall. Patient with RIGHT hip pain. EXAM: DG HIP (WITH OR WITHOUT PELVIS) 2-3V RIGHT COMPARISON:  January 10, 2022. FINDINGS: Signs of prior RIGHT hip arthroplasty without change or acute abnormality. Bony pelvis without signs of fracture. Soft tissues are grossly unremarkable. IMPRESSION: 1. Signs of prior RIGHT hip arthroplasty without change or acute abnormality. 2. No signs of fracture or dislocation. Electronically Signed   By: Zetta Bills M.D.   On: 04/24/2022 15:04   DG Chest Port 1 View  Result Date: 04/24/2022 CLINICAL DATA:  Trauma EXAM: PORTABLE CHEST 1 VIEW COMPARISON:  Radiograph 01/09/2022  FINDINGS: The cardiomediastinal silhouette is unchanged. There is no focal airspace consolidation. There is no pleural effusion. No pneumothorax. There is no acute osseous abnormality. There is skin fold overlying the right upper chest. IMPRESSION: No evidence of acute cardiopulmonary disease. No acute fracture on single frontal view of the chest. Electronically Signed   By: Maurine Simmering M.D.   On: 04/24/2022 15:03    Procedures Procedures    Medications Ordered in ED Medications  hydrOXYzine (ATARAX) tablet 25 mg (25 mg Oral Given 04/24/22 1638)    ED Course/ Medical Decision Making/ A&P                           Medical Decision Making Amount and/or Complexity of Data Reviewed Labs: ordered. Radiology: ordered. ECG/medicine tests: ordered.  Risk Prescription drug management.   88 year old female with medical history significant for dementia who presents to the emergency department after 2 falls the most recent of which was yesterday.  The patient's history was limited due to her dementia and was supplemented by her husband who is bedside.  He states that he still wishes to continue caring for her in the home and has no desire to escalate her care to a nursing facility.  He states that she wandered into  a neighbor's yard yesterday and likely fell into a rose bush and sustained skin tears to her arms bilaterally.  Her last tetanus was in 2017.  She complained of pain in the right hip but has been ambulatory since the fall.  No clear head trauma or loss of consciousness.  She is at her baseline mental status for dementia which waxes and wanes per her husband.  She arrived GCS 14, ABC intact.  On arrival, the patient was vitally stable.  Presenting with multiple skin tears to the extremities bilaterally after a fall outside.  Unclear head trauma or loss of consciousness but this is denied by the patient's husband.  CT imaging of the head and cervical spine was obtained in addition to chest x-ray  and x-ray of the hip and pelvis.  CT imaging was ultimately negative for sign of acute traumatic injury. EKG revealed NSR, rate 62. No STEMI.  CT Imaging: IMPRESSION:  1. No acute intracranial abnormality.  2. No acute displaced fracture or traumatic listhesis of the  cervical spine.  3. Couple bilateral upper lobe pulmonary micronodules. No follow-up  needed if patient is low-risk (and has no known or suspected primary  neoplasm). Non-contrast chest CT can be considered in 12 months if  patient is high-risk. This recommendation follows the consensus  statement: Guidelines for Management of Incidental Pulmonary Nodules  Detected on CT Images: From the Fleischner Society 2017; Radiology  2017; [id number].  4.  Aortic aneurysm NOS (ICD10-I71.9).    XR Hip: IMPRESSION:  1. Signs of prior RIGHT hip arthroplasty without change or acute  abnormality.  2. No signs of fracture or dislocation.   CXR without acute traumatic injury.  Patient well-appearing and at her baseline mental status.  She was administered Atarax at the request of her husband for mild agitation in the setting of her dementia and being in an unfamiliar location. Overall at baseline, stable for discharge at this time to the care of her husband.       Final Clinical Impression(s) / ED Diagnoses Final diagnoses:  Fall, initial encounter  Multiple skin tears    Rx / DC Orders ED Discharge Orders     None         Regan Lemming, MD 04/25/22 1010

## 2022-04-24 NOTE — Discharge Instructions (Signed)
Your imaging was negative for acute fracture

## 2022-04-24 NOTE — ED Triage Notes (Signed)
Ems brings pt in from home for multiple falls. Most recent fall was yesterday. Pt complains of right hip pain.

## 2022-04-27 DIAGNOSIS — S72001D Fracture of unspecified part of neck of right femur, subsequent encounter for closed fracture with routine healing: Secondary | ICD-10-CM | POA: Diagnosis not present

## 2022-04-27 DIAGNOSIS — I5032 Chronic diastolic (congestive) heart failure: Secondary | ICD-10-CM | POA: Diagnosis not present

## 2022-04-27 DIAGNOSIS — N1831 Chronic kidney disease, stage 3a: Secondary | ICD-10-CM | POA: Diagnosis not present

## 2022-04-27 DIAGNOSIS — L02424 Furuncle of left upper limb: Secondary | ICD-10-CM | POA: Diagnosis not present

## 2022-04-27 DIAGNOSIS — Z96641 Presence of right artificial hip joint: Secondary | ICD-10-CM | POA: Diagnosis not present

## 2022-04-27 DIAGNOSIS — I130 Hypertensive heart and chronic kidney disease with heart failure and stage 1 through stage 4 chronic kidney disease, or unspecified chronic kidney disease: Secondary | ICD-10-CM | POA: Diagnosis not present

## 2022-04-28 DIAGNOSIS — N1831 Chronic kidney disease, stage 3a: Secondary | ICD-10-CM | POA: Diagnosis not present

## 2022-04-28 DIAGNOSIS — Z96641 Presence of right artificial hip joint: Secondary | ICD-10-CM | POA: Diagnosis not present

## 2022-04-28 DIAGNOSIS — I130 Hypertensive heart and chronic kidney disease with heart failure and stage 1 through stage 4 chronic kidney disease, or unspecified chronic kidney disease: Secondary | ICD-10-CM | POA: Diagnosis not present

## 2022-04-28 DIAGNOSIS — L02424 Furuncle of left upper limb: Secondary | ICD-10-CM | POA: Diagnosis not present

## 2022-04-28 DIAGNOSIS — I5032 Chronic diastolic (congestive) heart failure: Secondary | ICD-10-CM | POA: Diagnosis not present

## 2022-04-28 DIAGNOSIS — S72001D Fracture of unspecified part of neck of right femur, subsequent encounter for closed fracture with routine healing: Secondary | ICD-10-CM | POA: Diagnosis not present

## 2022-05-01 DIAGNOSIS — I5032 Chronic diastolic (congestive) heart failure: Secondary | ICD-10-CM | POA: Diagnosis not present

## 2022-05-01 DIAGNOSIS — I130 Hypertensive heart and chronic kidney disease with heart failure and stage 1 through stage 4 chronic kidney disease, or unspecified chronic kidney disease: Secondary | ICD-10-CM | POA: Diagnosis not present

## 2022-05-01 DIAGNOSIS — Z96641 Presence of right artificial hip joint: Secondary | ICD-10-CM | POA: Diagnosis not present

## 2022-05-01 DIAGNOSIS — S72001D Fracture of unspecified part of neck of right femur, subsequent encounter for closed fracture with routine healing: Secondary | ICD-10-CM | POA: Diagnosis not present

## 2022-05-01 DIAGNOSIS — L02424 Furuncle of left upper limb: Secondary | ICD-10-CM | POA: Diagnosis not present

## 2022-05-01 DIAGNOSIS — N1831 Chronic kidney disease, stage 3a: Secondary | ICD-10-CM | POA: Diagnosis not present

## 2022-05-04 DIAGNOSIS — I130 Hypertensive heart and chronic kidney disease with heart failure and stage 1 through stage 4 chronic kidney disease, or unspecified chronic kidney disease: Secondary | ICD-10-CM | POA: Diagnosis not present

## 2022-05-04 DIAGNOSIS — L02424 Furuncle of left upper limb: Secondary | ICD-10-CM | POA: Diagnosis not present

## 2022-05-04 DIAGNOSIS — S72001D Fracture of unspecified part of neck of right femur, subsequent encounter for closed fracture with routine healing: Secondary | ICD-10-CM | POA: Diagnosis not present

## 2022-05-04 DIAGNOSIS — N1831 Chronic kidney disease, stage 3a: Secondary | ICD-10-CM | POA: Diagnosis not present

## 2022-05-04 DIAGNOSIS — Z96641 Presence of right artificial hip joint: Secondary | ICD-10-CM | POA: Diagnosis not present

## 2022-05-04 DIAGNOSIS — I5032 Chronic diastolic (congestive) heart failure: Secondary | ICD-10-CM | POA: Diagnosis not present

## 2022-05-10 DIAGNOSIS — Z96641 Presence of right artificial hip joint: Secondary | ICD-10-CM | POA: Diagnosis not present

## 2022-05-10 DIAGNOSIS — I130 Hypertensive heart and chronic kidney disease with heart failure and stage 1 through stage 4 chronic kidney disease, or unspecified chronic kidney disease: Secondary | ICD-10-CM | POA: Diagnosis not present

## 2022-05-10 DIAGNOSIS — S72001D Fracture of unspecified part of neck of right femur, subsequent encounter for closed fracture with routine healing: Secondary | ICD-10-CM | POA: Diagnosis not present

## 2022-05-10 DIAGNOSIS — I5032 Chronic diastolic (congestive) heart failure: Secondary | ICD-10-CM | POA: Diagnosis not present

## 2022-05-10 DIAGNOSIS — L02424 Furuncle of left upper limb: Secondary | ICD-10-CM | POA: Diagnosis not present

## 2022-05-10 DIAGNOSIS — N1831 Chronic kidney disease, stage 3a: Secondary | ICD-10-CM | POA: Diagnosis not present

## 2022-05-21 DIAGNOSIS — I10 Essential (primary) hypertension: Secondary | ICD-10-CM | POA: Diagnosis not present

## 2022-05-21 DIAGNOSIS — I5032 Chronic diastolic (congestive) heart failure: Secondary | ICD-10-CM | POA: Diagnosis not present

## 2022-05-25 DIAGNOSIS — Z6822 Body mass index (BMI) 22.0-22.9, adult: Secondary | ICD-10-CM | POA: Diagnosis not present

## 2022-05-25 DIAGNOSIS — R296 Repeated falls: Secondary | ICD-10-CM | POA: Diagnosis not present

## 2022-05-25 DIAGNOSIS — G301 Alzheimer's disease with late onset: Secondary | ICD-10-CM | POA: Diagnosis not present

## 2022-06-13 DIAGNOSIS — G301 Alzheimer's disease with late onset: Secondary | ICD-10-CM | POA: Diagnosis not present

## 2022-06-13 DIAGNOSIS — Z111 Encounter for screening for respiratory tuberculosis: Secondary | ICD-10-CM | POA: Diagnosis not present

## 2022-06-13 DIAGNOSIS — Z6821 Body mass index (BMI) 21.0-21.9, adult: Secondary | ICD-10-CM | POA: Diagnosis not present

## 2022-06-27 DIAGNOSIS — G301 Alzheimer's disease with late onset: Secondary | ICD-10-CM | POA: Diagnosis not present

## 2022-06-27 DIAGNOSIS — K219 Gastro-esophageal reflux disease without esophagitis: Secondary | ICD-10-CM | POA: Diagnosis not present

## 2022-06-27 DIAGNOSIS — I10 Essential (primary) hypertension: Secondary | ICD-10-CM | POA: Diagnosis not present

## 2022-06-27 DIAGNOSIS — E039 Hypothyroidism, unspecified: Secondary | ICD-10-CM | POA: Diagnosis not present

## 2022-07-02 IMAGING — DX DG KNEE 1-2V PORT*R*
2 series · 2 of 2 positions shown · non-contrast
Comparison: None.

CLINICAL DATA: Fall, right hip pain.  Right hip fracture.

EXAM:
PORTABLE RIGHT KNEE - 1-2 VIEW

[knee ap]
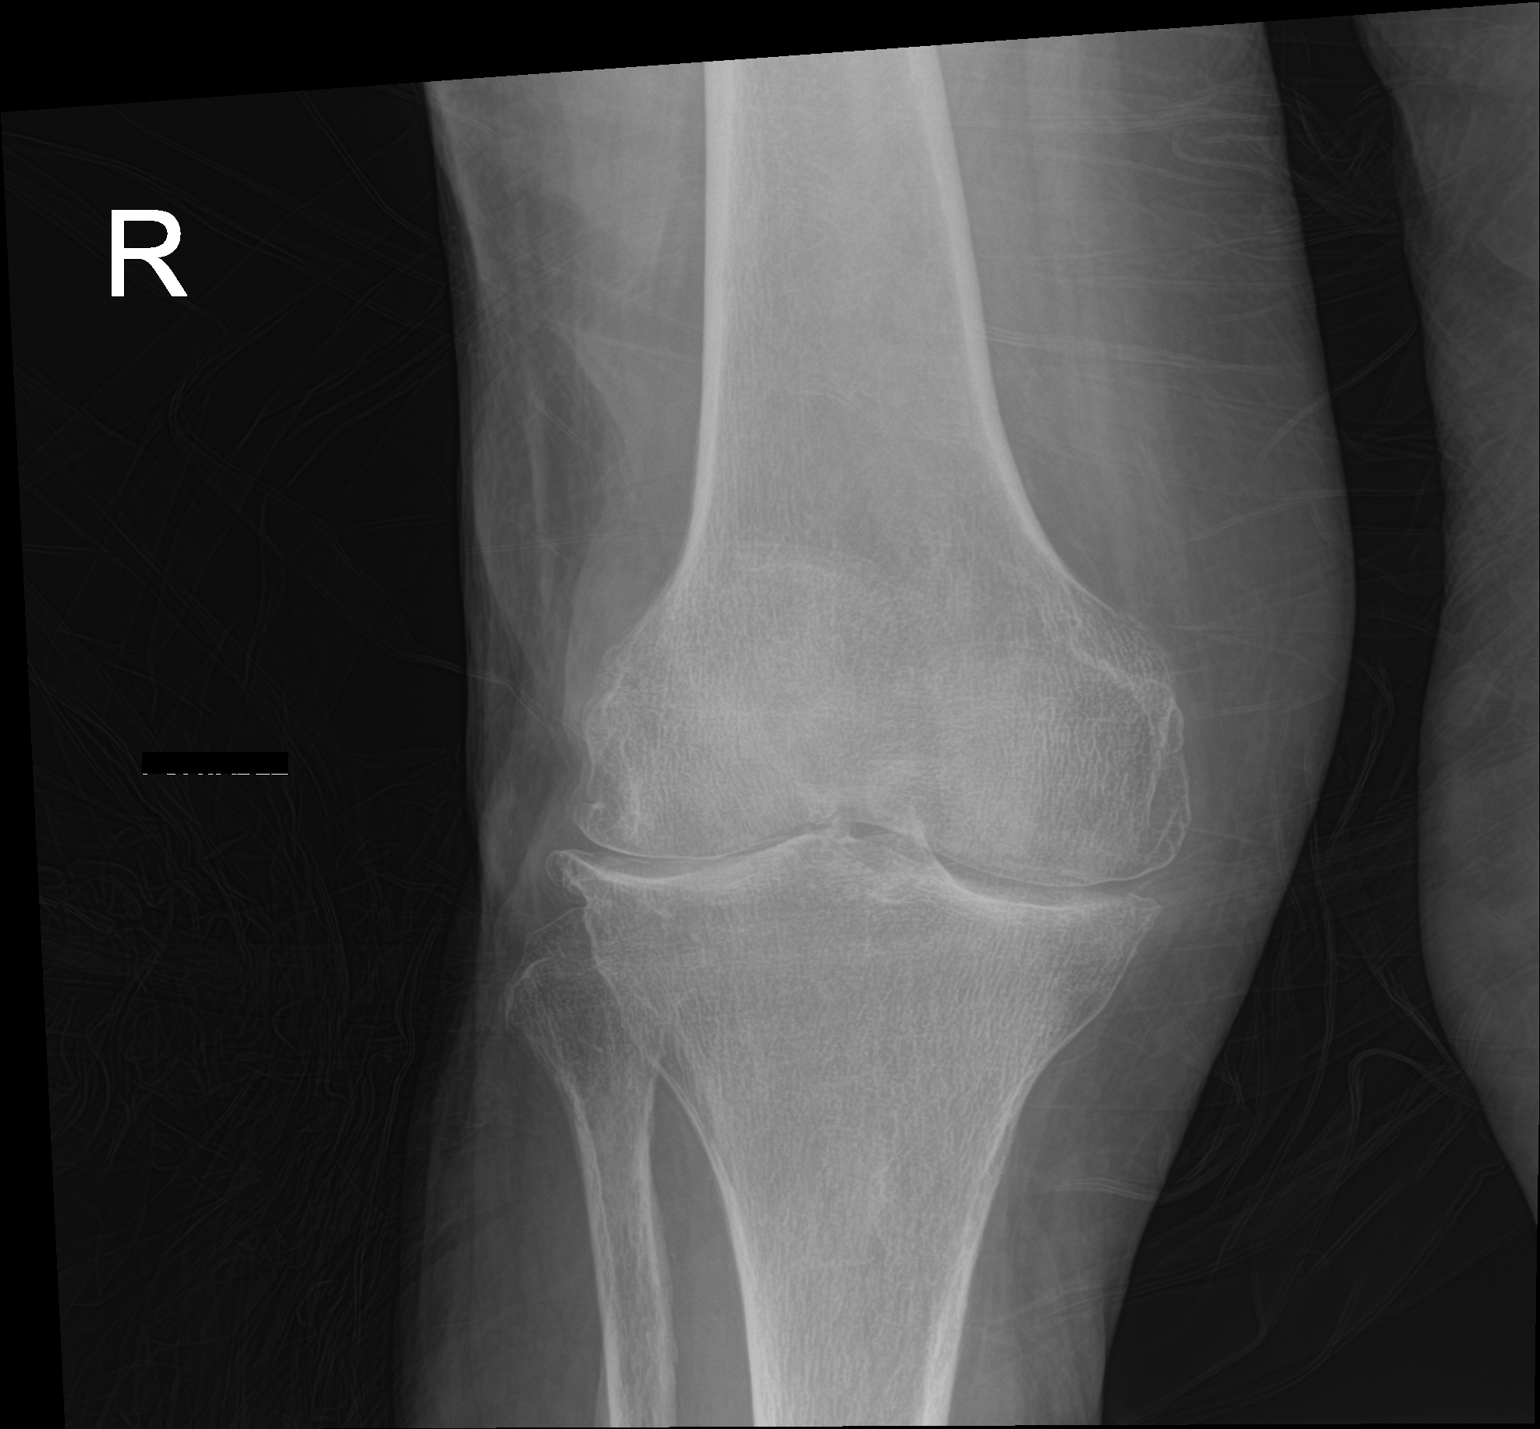

[knee lat]
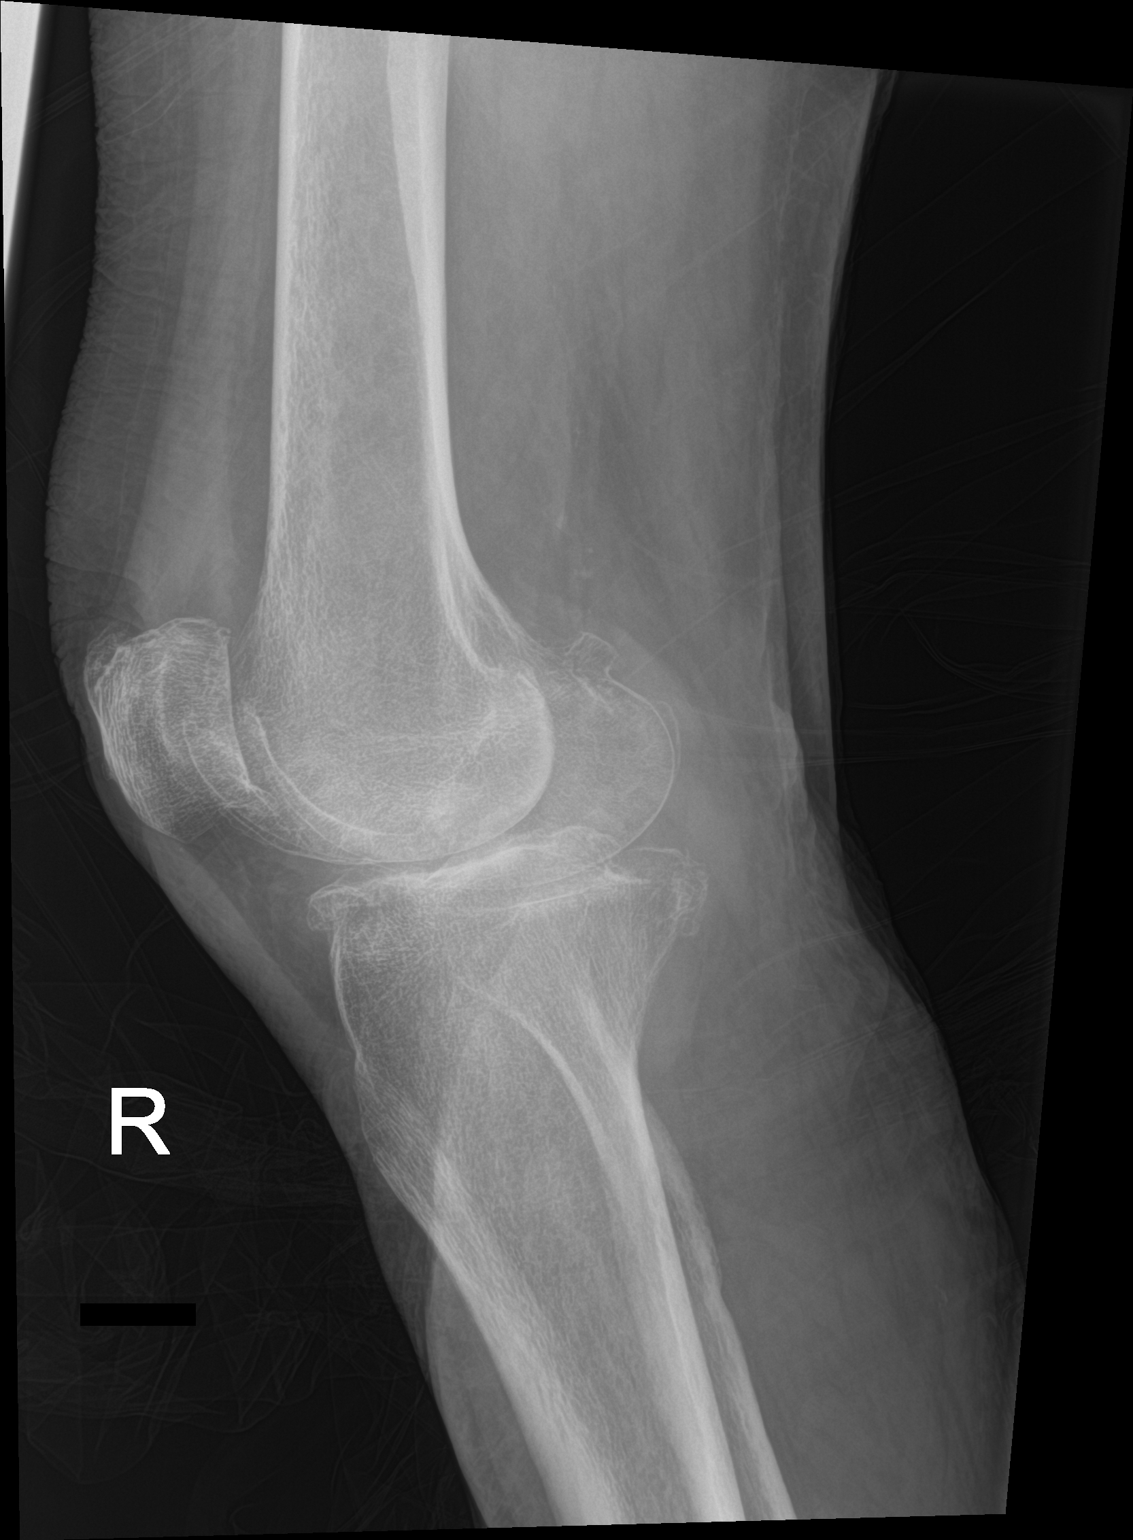

[2 of 2 positions shown; findings below may reference images not displayed]

FINDINGS: No fracture or dislocation. Lateral view slightly limited by
positioning. Moderate osteoarthritis with joint space narrowing and
spurring. Trace knee joint effusion. Small quadriceps tendon
enthesophyte.
IMPRESSION: 1. No fracture or dislocation of the right knee.
2. Moderate osteoarthritis.

## 2022-07-03 IMAGING — XA DG HIP (WITH OR WITHOUT PELVIS) 1V*R*
1 series · 2 of 2 positions shown · non-contrast
Comparison: Right hip radiographs dated 01/09/2022

Fluoroscopy time: 5 seconds, 0.54 mGy

CLINICAL DATA: Right shoulder upper placement

EXAM:
DG HIP (WITH OR WITHOUT PELVIS) 1V RIGHT

[Series 1: unknown protocol · 2 of 2 slices shown]
[im 1/2]
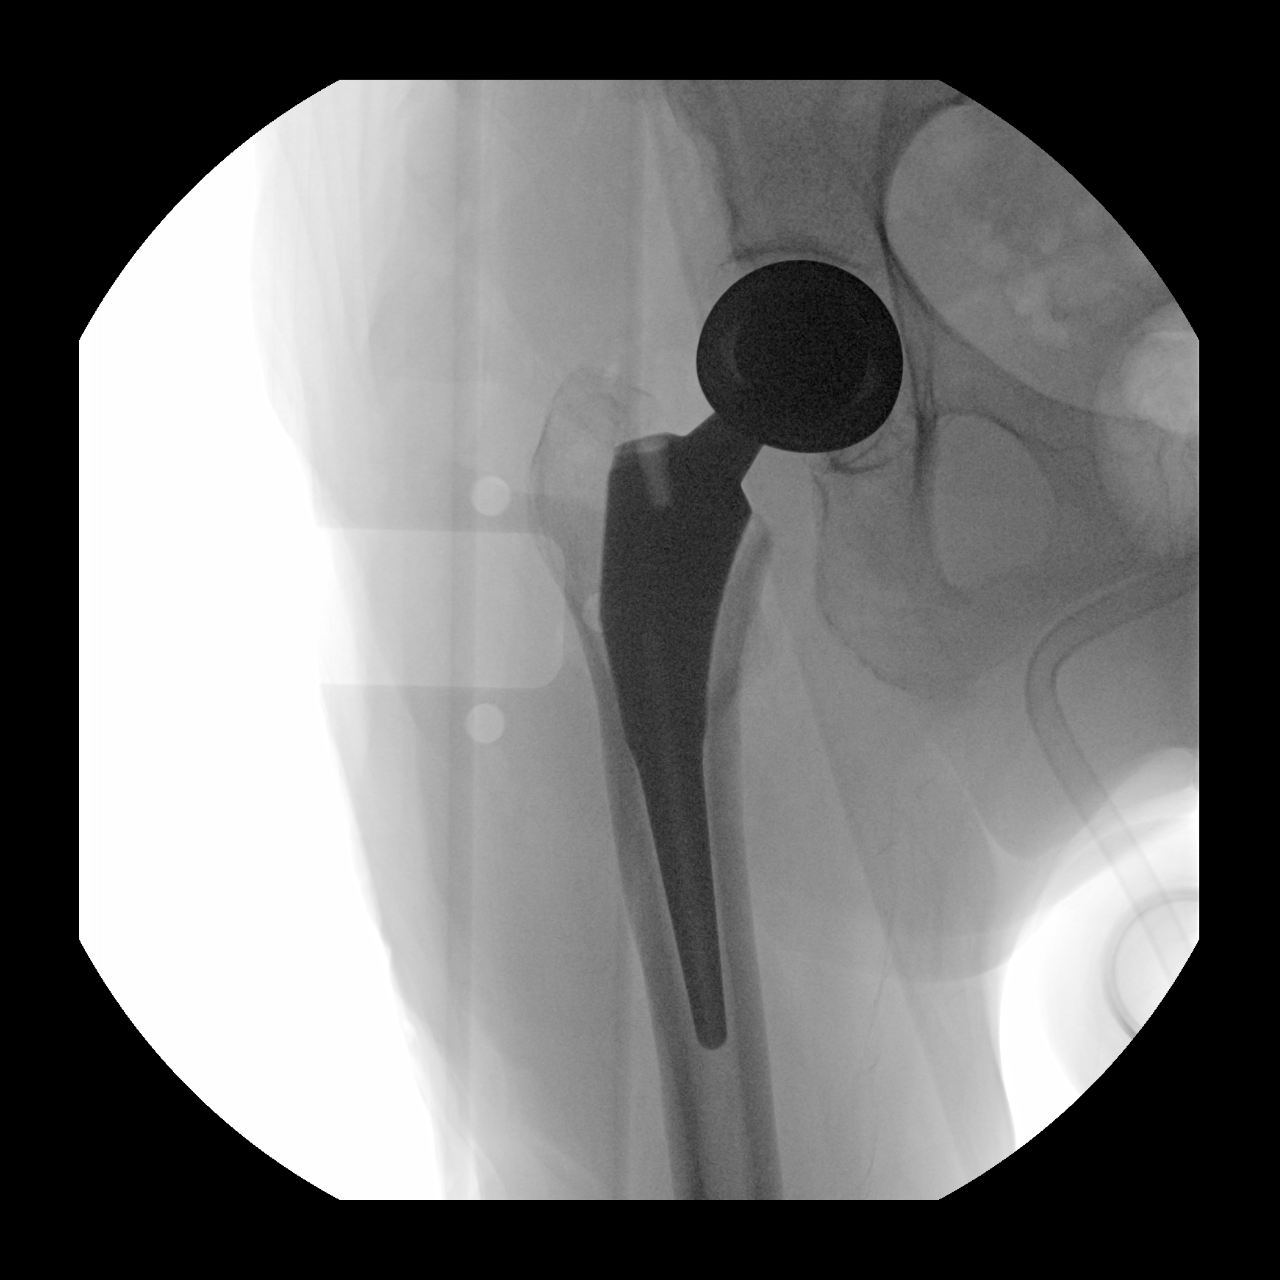
[im 2/2]
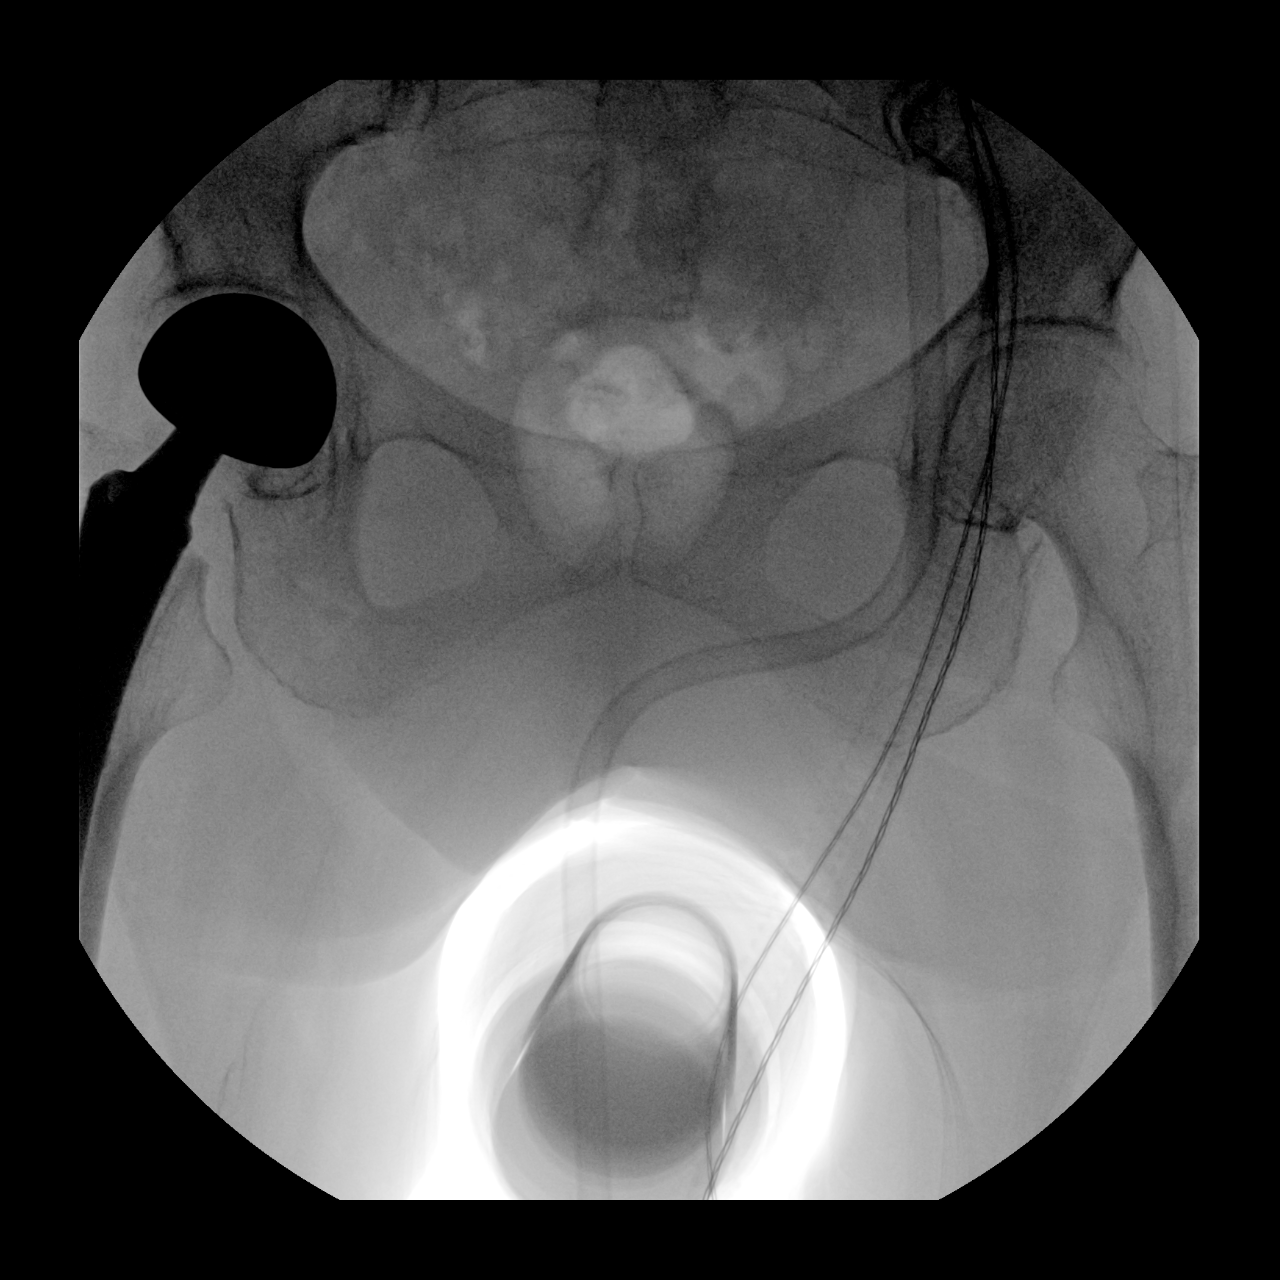

[2 of 2 positions shown; findings below may reference images not displayed]

FINDINGS: Intraoperative fluoroscopic radiographs during right hip
hemiarthroplasty, in satisfactory position.
IMPRESSION: Intraoperative fluoroscopic radiographs during right hip
hemiarthroplasty, in satisfactory position.

## 2022-07-03 IMAGING — DX DG PORTABLE PELVIS
1 series · 1 of 1 positions shown · non-contrast
Comparison: Right hip x-ray 01/09/2022.

CLINICAL DATA: Postop right total hip replacement.

EXAM:
PORTABLE PELVIS 1-2 VIEWS

[pelvis lat]
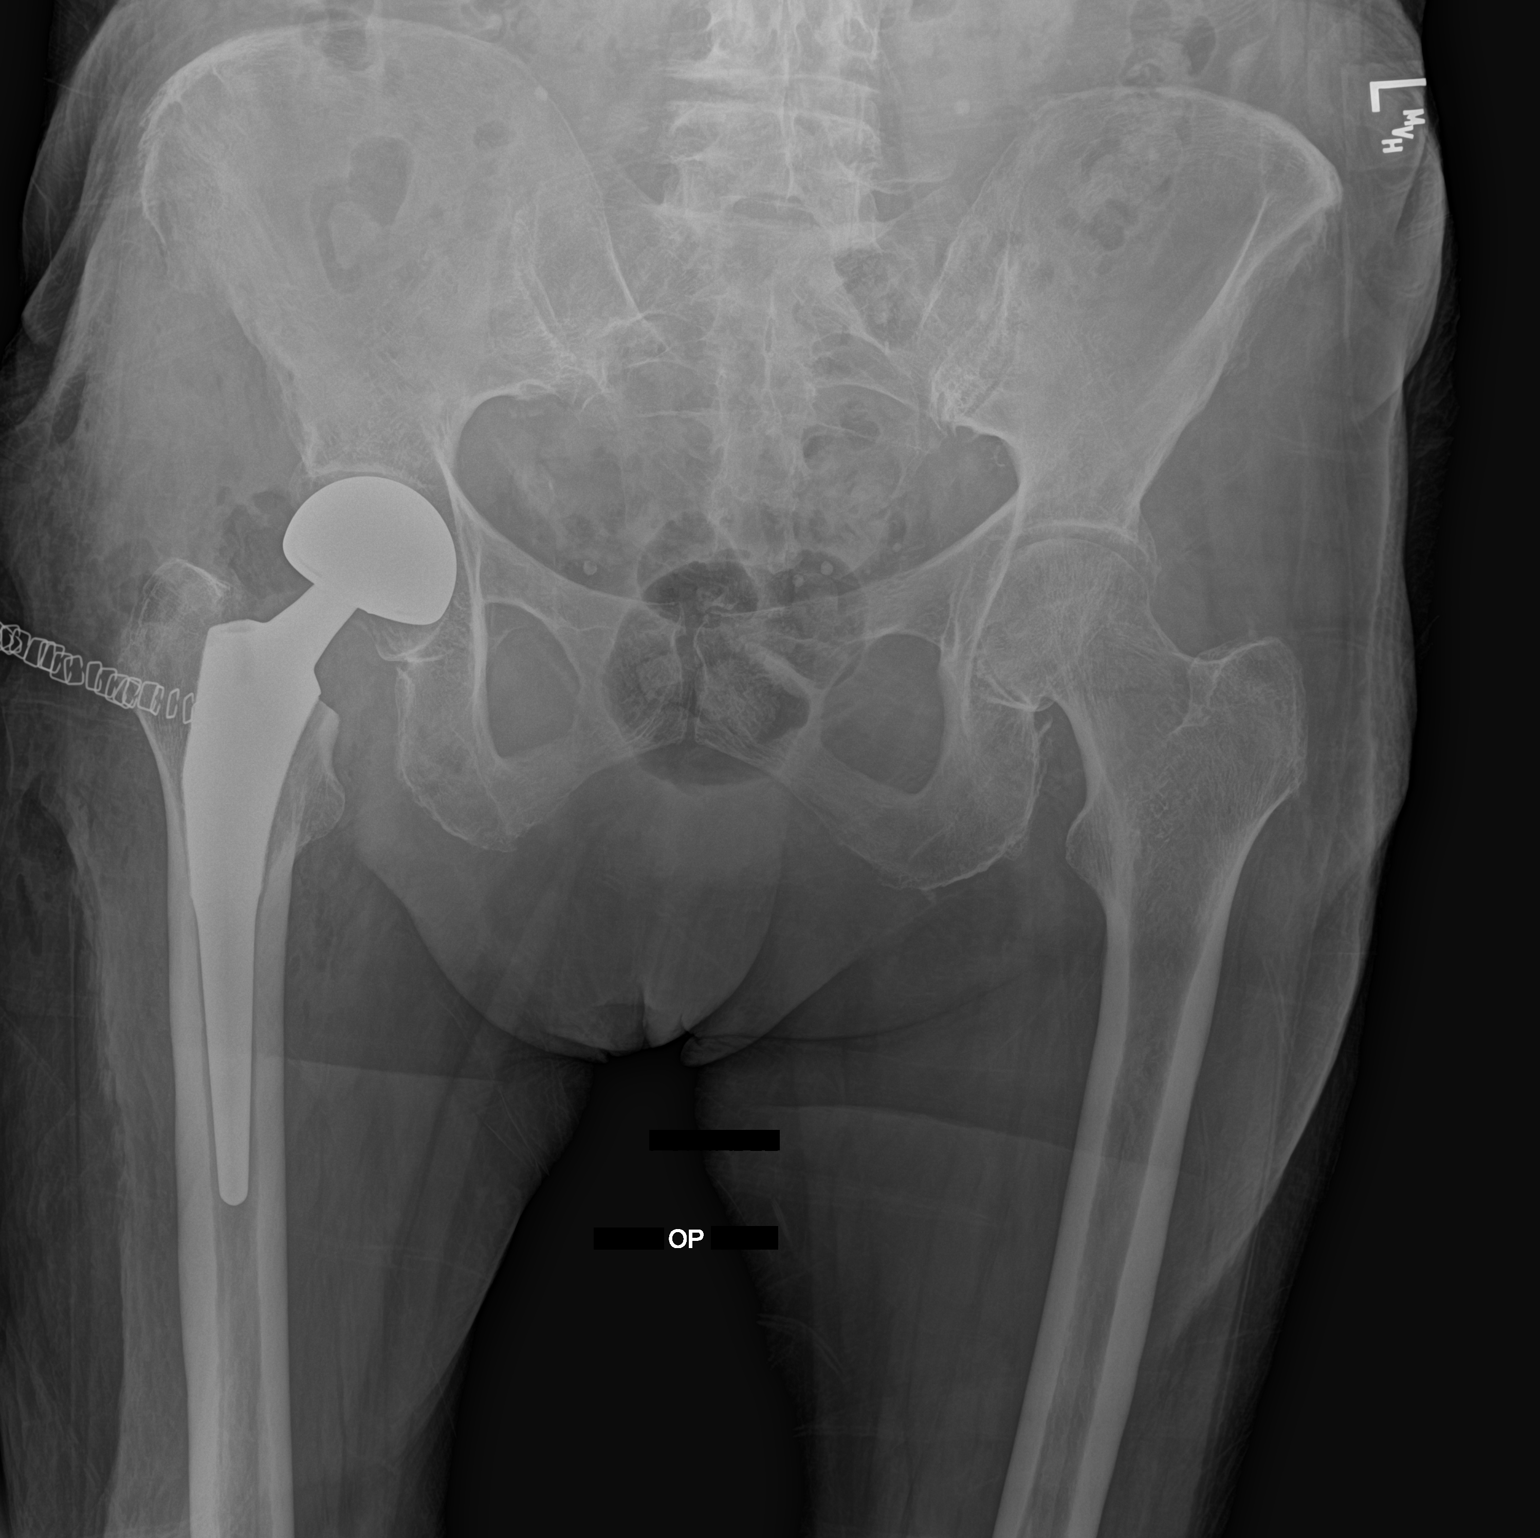

[1 of 1 positions shown; findings below may reference images not displayed]

FINDINGS: There is a new right hip total arthroplasty in anatomic alignment.
There is soft tissue swelling, air and skin staples compatible with
recent surgery. No acute fracture. Hardware appears intact.
IMPRESSION: 1. New right hip arthroplasty in anatomic alignment.

## 2022-07-06 DIAGNOSIS — M6281 Muscle weakness (generalized): Secondary | ICD-10-CM | POA: Diagnosis not present

## 2022-07-10 DIAGNOSIS — R296 Repeated falls: Secondary | ICD-10-CM | POA: Diagnosis not present

## 2022-07-10 DIAGNOSIS — G309 Alzheimer's disease, unspecified: Secondary | ICD-10-CM | POA: Diagnosis not present

## 2022-07-10 DIAGNOSIS — M6281 Muscle weakness (generalized): Secondary | ICD-10-CM | POA: Diagnosis not present

## 2022-07-10 DIAGNOSIS — R262 Difficulty in walking, not elsewhere classified: Secondary | ICD-10-CM | POA: Diagnosis not present

## 2022-07-10 DIAGNOSIS — I10 Essential (primary) hypertension: Secondary | ICD-10-CM | POA: Diagnosis not present

## 2022-07-10 DIAGNOSIS — F32A Depression, unspecified: Secondary | ICD-10-CM | POA: Diagnosis not present

## 2022-07-12 DIAGNOSIS — M6281 Muscle weakness (generalized): Secondary | ICD-10-CM | POA: Diagnosis not present

## 2022-07-13 DIAGNOSIS — R262 Difficulty in walking, not elsewhere classified: Secondary | ICD-10-CM | POA: Diagnosis not present

## 2022-07-13 DIAGNOSIS — M6281 Muscle weakness (generalized): Secondary | ICD-10-CM | POA: Diagnosis not present

## 2022-07-13 DIAGNOSIS — G309 Alzheimer's disease, unspecified: Secondary | ICD-10-CM | POA: Diagnosis not present

## 2022-07-13 DIAGNOSIS — R296 Repeated falls: Secondary | ICD-10-CM | POA: Diagnosis not present

## 2022-07-14 DIAGNOSIS — R296 Repeated falls: Secondary | ICD-10-CM | POA: Diagnosis not present

## 2022-07-14 DIAGNOSIS — R262 Difficulty in walking, not elsewhere classified: Secondary | ICD-10-CM | POA: Diagnosis not present

## 2022-07-14 DIAGNOSIS — G309 Alzheimer's disease, unspecified: Secondary | ICD-10-CM | POA: Diagnosis not present

## 2022-07-14 DIAGNOSIS — M6281 Muscle weakness (generalized): Secondary | ICD-10-CM | POA: Diagnosis not present

## 2022-07-17 DIAGNOSIS — G309 Alzheimer's disease, unspecified: Secondary | ICD-10-CM | POA: Diagnosis not present

## 2022-07-17 DIAGNOSIS — R262 Difficulty in walking, not elsewhere classified: Secondary | ICD-10-CM | POA: Diagnosis not present

## 2022-07-17 DIAGNOSIS — M6281 Muscle weakness (generalized): Secondary | ICD-10-CM | POA: Diagnosis not present

## 2022-07-17 DIAGNOSIS — R296 Repeated falls: Secondary | ICD-10-CM | POA: Diagnosis not present

## 2022-07-18 ENCOUNTER — Encounter: Payer: Self-pay | Admitting: Family Medicine

## 2022-07-18 ENCOUNTER — Non-Acute Institutional Stay: Payer: Medicare Other | Admitting: Family Medicine

## 2022-07-18 VITALS — BP 110/56 | HR 61 | Resp 16

## 2022-07-18 DIAGNOSIS — Z515 Encounter for palliative care: Secondary | ICD-10-CM | POA: Diagnosis not present

## 2022-07-18 DIAGNOSIS — R296 Repeated falls: Secondary | ICD-10-CM | POA: Diagnosis not present

## 2022-07-18 DIAGNOSIS — M6281 Muscle weakness (generalized): Secondary | ICD-10-CM | POA: Diagnosis not present

## 2022-07-18 DIAGNOSIS — H1032 Unspecified acute conjunctivitis, left eye: Secondary | ICD-10-CM | POA: Diagnosis not present

## 2022-07-18 NOTE — Progress Notes (Signed)
[facility] Consult Note Telephone: [phone]  Fax: [phone]   Date of encounter: 07/18/22 8:32 AM PATIENT NAME: Karen Pearson 88 [address] [place] [address]   [phone] (home)  DOB: 12-18-1924 MRN: [id number] PRIMARY CARE PROVIDER:    Cari Caraway, MD,  [place] [address] [phone]  REFERRING PROVIDER:   Cari Caraway, [place],  [place] [address] [phone]  RESPONSIBLE PARTY:    Contact Information     Name Relation Home Work [place] [phone]     [name] Daughter [phone]     [name]   [phone]        I met face to face with patient in her memory care facility. Palliative Care was asked to follow this patient by consultation request of  Cari Caraway, MD to address advance care planning and complex medical decision making. This is the initial visit.          ASSESSMENT, SYMPTOM MANAGEMENT AND PLAN / RECOMMENDATIONS:   Acute conjunctivitis Likely viral versus allergic. Zaditor eye gtts-instill 1 gtt left eye BID.   Monitor for drainage, visual change, pain with eye movement. If develops similar symptoms in right eye-can instill 1 gtt in both eyes  If develops discolored drainage, may need to add Polymycin eye ointment.  2.  Palliative Care Encounter Need to follow up with spouse to confirm goals of care  3.  Frequent Falls Pt with poor safety awareness-Needs standby assist when ambulatory. Use WC for mobility when pt fatigued or at end of day.    Follow up Palliative Care Visit: Palliative care will continue to follow for complex medical decision making, advance care planning, and clarification of goals. Return 4 weeks or prn.    This visit was coded based on medical decision making (MDM).  PPS: 50%  HOSPICE ELIGIBILITY/DIAGNOSIS: TBD  Chief Complaint:  [facility] received a referral to follow up with patient for chronic disease management of chronic diastolic heart failure in setting of dementia.  Palliative Care is also following for advance directive planning and defining/refining goals of care.  HISTORY OF PRESENT ILLNESS:  Karen Pearson is a 88 y.o. year old female with chronic diastolic heart failure, dementia, HTN, osteoporosis with hx of hip fracture 3/23 s/p hemiarthroplasty, hypothyroidism, GERD, post herpetic neuralgia with left forehead scalp pain.  She also has hx of depression, anxiety and melanoma.  Pt is ambulatory, incontinent of bowel and bladder. Denies pain, SOB, vision problems, itching of left eye, nausea, vomiting, dysuria or constipation. Seen feeding herself breakfast.  Seated in WC. She is dependent for bathing and dressing.  History obtained from review of EMR, discussion with facility staff and/or Karen Pearson.  04/24/22 CT head and cervical spine: IMPRESSION: 1. No acute intracranial abnormality. 2. No acute displaced fracture or traumatic listhesis of the cervical spine. 3. Couple bilateral upper lobe pulmonary micronodules. No follow-up needed if patient is low-risk (and has no known or suspected primary neoplasm). Non-contrast chest CT can be considered in 12 months if patient is high-risk. This recommendation follows the consensus statement: Guidelines for Management of Incidental Pulmonary Nodules Detected on CT Images: From the Fleischner Society 2017; Radiology 2017; 284:228-243. 4.  Aortic aneurysm NOS (ICD10-I71.9).   04/24/22 Diagnostic hip with/wo pelvis right: IMPRESSION: 1. Signs of prior RIGHT hip arthroplasty without change or acute abnormality. 2. No signs of fracture or dislocation.    Latest Ref  Rng & Units 01/11/2022    7:32 AM 01/11/2022    3:36 AM 01/10/2022   12:34 AM  CMP  Glucose 70 - 99 mg/dL 103  128  157   BUN 8 - 23 mg/dL 23  23  19    Creatinine 0.44 - 1.00 mg/dL 0.98  0.90  0.92    Sodium 135 - 145 mmol/L 134  135  136   Potassium 3.5 - 5.1 mmol/L 4.1  4.2  3.7   Chloride 98 - 111 mmol/L 102  103  104   CO2 22 - 32 mmol/L 23  21  18    Calcium 8.9 - 10.3 mg/dL 8.4  8.5  8.8        Latest Ref Rng & Units 01/11/2022    3:36 AM 01/10/2022   12:34 AM 01/09/2022    6:36 PM  CBC  WBC 4.0 - 10.5 K/uL 10.3  10.3  10.1   Hemoglobin 12.0 - 15.0 g/dL 10.7  11.7  12.0   Hematocrit 36.0 - 46.0 % 33.2  35.3  36.1   Platelets 150 - 400 K/uL 233  258  247     I reviewed EMR for available labs, medications, imaging, studies and related documents.  No new records since last visit/Records reviewed and summarized above.   ROS-obtained from pt and staff General: NAD EYES: denies vision changes or pain with eye movement, itching Cardiovascular: denies chest pain, denies DOE Pulmonary:  denies SOB Abdomen: endorses good appetite, denies constipation, endorses incontinence of bowel GU: denies dysuria, endorses incontinence of urine MSK:  generalized weakness with high fall risk Skin: denies rashes or wounds Neurological: denies pain  Physical Exam: Current and past weights:  125 lbs as of 03/17/22 Constitutional: NAD General: frail appearing, thin EYES: left eye injected, no d/c ENMT: intact hearing, oral mucous membranes moist, dentition intact CV: S1S2, RRR, no LE edema Pulmonary: CTAB, no increased work of breathing, no cough, room air Abdomen: normo-active BS + 4 quadrants, soft and non tender MSK: no sarcopenia, moves all extremities, minimally ambulatory/wc use Skin: warm and dry, no rashes or wounds on visible skin Neuro:  no generalized weakness, noted cognitive impairment Psych: non-anxious affect, A and O x 1 Hem/lymph/immuno: no widespread bruising  CURRENT PROBLEM LIST:  Patient Active Problem List   Diagnosis Date Noted   Acute conjunctivitis of left eye 07/18/2022   Frequent falls 01/10/2022   Hyperglycemia 01/10/2022   Normocytic anemia 01/10/2022    Fracture of femoral neck, right ([facility]) 01/09/2022   Debility 01/09/2022   Closed displaced fracture of right femoral neck ([facility]) 01/09/2022   Essential hypertension 01/09/2022   Hyperlipidemia 01/09/2022   Dementia with behavioral disturbance ([facility]) 01/09/2022   Chronic diastolic CHF (congestive heart failure) ([facility]) 01/09/2022   Hypothyroidism 04/23/2016   PAST MEDICAL HISTORY:  Active Ambulatory Problems    Diagnosis Date Noted   Hypothyroidism 04/23/2016   Fracture of femoral neck, right ([facility]) 01/09/2022   Debility 01/09/2022   Closed displaced fracture of right femoral neck ([facility]) 01/09/2022   Essential hypertension 01/09/2022   Hyperlipidemia 01/09/2022   Dementia with behavioral disturbance ([facility]) 01/09/2022   Chronic diastolic CHF (congestive heart failure) ([facility]) 01/09/2022   Frequent falls 01/10/2022   Hyperglycemia 01/10/2022   Normocytic anemia 01/10/2022   Acute conjunctivitis of left eye 07/18/2022   Resolved Ambulatory Problems    Diagnosis Date Noted   Subarachnoid bleed ([facility]) 04/22/2016   Syncope 04/22/2016   Past Medical History:  Diagnosis Date  Cancer ([facility])    High cholesterol    Hypertension    Melanoma ([facility])    Thyroid disease    SOCIAL HX:  Social History   Tobacco Use   Smoking status: Never   Smokeless tobacco: Never  Substance Use Topics   Alcohol use: No   FAMILY HX: History reviewed. No pertinent family history.     Preferred Pharmacy: ALLERGIES:  Allergies  Allergen Reactions   Acetaminophen Other (See Comments)    made her hyperactive Ordered per PCP who shows no allergies.     PERTINENT MEDICATIONS:  Outpatient Encounter Medications as of 07/18/2022  Medication Sig   acetaminophen (TYLENOL) 500 MG tablet Take 500 mg by mouth every 6 (six) hours as needed for mild pain, moderate pain or fever.   pantoprazole (PROTONIX) 20 MG tablet Take 20 mg by mouth daily.   risperiDONE (RISPERDAL) 0.5 MG tablet Take 0.5 mg by mouth at bedtime.    HYDROcodone-acetaminophen (NORCO/VICODIN) 5-325 MG tablet Take 1-2 tablets by mouth every 4 (four) hours as needed for moderate pain (pain score 4-6). (Patient not taking: Reported on 07/18/2022)   hydrOXYzine (VISTARIL) 25 MG capsule Take 25 mg by mouth at bedtime. (Patient not taking: Reported on 07/18/2022)   VITAMIN E PO Take 1 capsule by mouth daily. (Patient not taking: Reported on 07/18/2022)   No facility-administered encounter medications on file as of 07/18/2022.     ----------------------------------------------------------------------------------------------------- Advance Care Planning/Goals of Care:  Identification  of a healthcare agent-spouse [facility] not to resuscitate or to de-escalate disease focused treatments due to poor prognosis. CODE STATUS: Last code status listed as DNR    Thank you for the opportunity to participate in the care of Karen Pearson.  The palliative care team will continue to follow. Please call our office at [phone] if we can be of additional assistance.   Marijo Conception, FNP-C  COVID-19 PATIENT SCREENING TOOL Asked and negative response unless otherwise noted:  Have you had symptoms of covid, tested positive or been in contact with someone with symptoms/positive test in the past 5-10 days?  No

## 2022-07-20 DIAGNOSIS — G309 Alzheimer's disease, unspecified: Secondary | ICD-10-CM | POA: Diagnosis not present

## 2022-07-20 DIAGNOSIS — R262 Difficulty in walking, not elsewhere classified: Secondary | ICD-10-CM | POA: Diagnosis not present

## 2022-07-20 DIAGNOSIS — M6281 Muscle weakness (generalized): Secondary | ICD-10-CM | POA: Diagnosis not present

## 2022-07-20 DIAGNOSIS — R296 Repeated falls: Secondary | ICD-10-CM | POA: Diagnosis not present

## 2022-07-21 DIAGNOSIS — G309 Alzheimer's disease, unspecified: Secondary | ICD-10-CM | POA: Diagnosis not present

## 2022-07-21 DIAGNOSIS — M6281 Muscle weakness (generalized): Secondary | ICD-10-CM | POA: Diagnosis not present

## 2022-07-21 DIAGNOSIS — R262 Difficulty in walking, not elsewhere classified: Secondary | ICD-10-CM | POA: Diagnosis not present

## 2022-07-21 DIAGNOSIS — R296 Repeated falls: Secondary | ICD-10-CM | POA: Diagnosis not present

## 2022-07-25 DIAGNOSIS — G309 Alzheimer's disease, unspecified: Secondary | ICD-10-CM | POA: Diagnosis not present

## 2022-07-25 DIAGNOSIS — M6281 Muscle weakness (generalized): Secondary | ICD-10-CM | POA: Diagnosis not present

## 2022-07-25 DIAGNOSIS — R262 Difficulty in walking, not elsewhere classified: Secondary | ICD-10-CM | POA: Diagnosis not present

## 2022-07-25 DIAGNOSIS — R296 Repeated falls: Secondary | ICD-10-CM | POA: Diagnosis not present

## 2022-07-26 DIAGNOSIS — L570 Actinic keratosis: Secondary | ICD-10-CM | POA: Diagnosis not present

## 2022-07-26 DIAGNOSIS — M6281 Muscle weakness (generalized): Secondary | ICD-10-CM | POA: Diagnosis not present

## 2022-07-26 DIAGNOSIS — S81811A Laceration without foreign body, right lower leg, initial encounter: Secondary | ICD-10-CM | POA: Diagnosis not present

## 2022-07-26 DIAGNOSIS — D0439 Carcinoma in situ of skin of other parts of face: Secondary | ICD-10-CM | POA: Diagnosis not present

## 2022-07-26 DIAGNOSIS — Z08 Encounter for follow-up examination after completed treatment for malignant neoplasm: Secondary | ICD-10-CM | POA: Diagnosis not present

## 2022-07-26 DIAGNOSIS — R296 Repeated falls: Secondary | ICD-10-CM | POA: Diagnosis not present

## 2022-07-26 DIAGNOSIS — X32XXXD Exposure to sunlight, subsequent encounter: Secondary | ICD-10-CM | POA: Diagnosis not present

## 2022-07-26 DIAGNOSIS — R262 Difficulty in walking, not elsewhere classified: Secondary | ICD-10-CM | POA: Diagnosis not present

## 2022-07-26 DIAGNOSIS — G309 Alzheimer's disease, unspecified: Secondary | ICD-10-CM | POA: Diagnosis not present

## 2022-07-26 DIAGNOSIS — Z85828 Personal history of other malignant neoplasm of skin: Secondary | ICD-10-CM | POA: Diagnosis not present

## 2022-07-27 DIAGNOSIS — G301 Alzheimer's disease with late onset: Secondary | ICD-10-CM | POA: Diagnosis not present

## 2022-07-27 DIAGNOSIS — F03B3 Unspecified dementia, moderate, with mood disturbance: Secondary | ICD-10-CM | POA: Diagnosis not present

## 2022-07-27 DIAGNOSIS — F419 Anxiety disorder, unspecified: Secondary | ICD-10-CM | POA: Diagnosis not present

## 2022-07-27 DIAGNOSIS — F331 Major depressive disorder, recurrent, moderate: Secondary | ICD-10-CM | POA: Diagnosis not present

## 2022-07-27 DIAGNOSIS — M6281 Muscle weakness (generalized): Secondary | ICD-10-CM | POA: Diagnosis not present

## 2022-07-28 DIAGNOSIS — R296 Repeated falls: Secondary | ICD-10-CM | POA: Diagnosis not present

## 2022-07-28 DIAGNOSIS — R262 Difficulty in walking, not elsewhere classified: Secondary | ICD-10-CM | POA: Diagnosis not present

## 2022-07-28 DIAGNOSIS — M6281 Muscle weakness (generalized): Secondary | ICD-10-CM | POA: Diagnosis not present

## 2022-07-28 DIAGNOSIS — G309 Alzheimer's disease, unspecified: Secondary | ICD-10-CM | POA: Diagnosis not present

## 2022-07-31 DIAGNOSIS — M6281 Muscle weakness (generalized): Secondary | ICD-10-CM | POA: Diagnosis not present

## 2022-08-01 DIAGNOSIS — R262 Difficulty in walking, not elsewhere classified: Secondary | ICD-10-CM | POA: Diagnosis not present

## 2022-08-01 DIAGNOSIS — M6281 Muscle weakness (generalized): Secondary | ICD-10-CM | POA: Diagnosis not present

## 2022-08-01 DIAGNOSIS — G309 Alzheimer's disease, unspecified: Secondary | ICD-10-CM | POA: Diagnosis not present

## 2022-08-01 DIAGNOSIS — R296 Repeated falls: Secondary | ICD-10-CM | POA: Diagnosis not present

## 2022-08-02 DIAGNOSIS — G309 Alzheimer's disease, unspecified: Secondary | ICD-10-CM | POA: Diagnosis not present

## 2022-08-02 DIAGNOSIS — R296 Repeated falls: Secondary | ICD-10-CM | POA: Diagnosis not present

## 2022-08-02 DIAGNOSIS — M6281 Muscle weakness (generalized): Secondary | ICD-10-CM | POA: Diagnosis not present

## 2022-08-02 DIAGNOSIS — R262 Difficulty in walking, not elsewhere classified: Secondary | ICD-10-CM | POA: Diagnosis not present

## 2022-08-03 DIAGNOSIS — G309 Alzheimer's disease, unspecified: Secondary | ICD-10-CM | POA: Diagnosis not present

## 2022-08-03 DIAGNOSIS — M6281 Muscle weakness (generalized): Secondary | ICD-10-CM | POA: Diagnosis not present

## 2022-08-03 DIAGNOSIS — R296 Repeated falls: Secondary | ICD-10-CM | POA: Diagnosis not present

## 2022-08-03 DIAGNOSIS — R262 Difficulty in walking, not elsewhere classified: Secondary | ICD-10-CM | POA: Diagnosis not present

## 2022-08-07 DIAGNOSIS — R296 Repeated falls: Secondary | ICD-10-CM | POA: Diagnosis not present

## 2022-08-07 DIAGNOSIS — R262 Difficulty in walking, not elsewhere classified: Secondary | ICD-10-CM | POA: Diagnosis not present

## 2022-08-07 DIAGNOSIS — M6281 Muscle weakness (generalized): Secondary | ICD-10-CM | POA: Diagnosis not present

## 2022-08-07 DIAGNOSIS — G309 Alzheimer's disease, unspecified: Secondary | ICD-10-CM | POA: Diagnosis not present

## 2022-08-09 DIAGNOSIS — G309 Alzheimer's disease, unspecified: Secondary | ICD-10-CM | POA: Diagnosis not present

## 2022-08-09 DIAGNOSIS — R262 Difficulty in walking, not elsewhere classified: Secondary | ICD-10-CM | POA: Diagnosis not present

## 2022-08-09 DIAGNOSIS — R296 Repeated falls: Secondary | ICD-10-CM | POA: Diagnosis not present

## 2022-08-09 DIAGNOSIS — M6281 Muscle weakness (generalized): Secondary | ICD-10-CM | POA: Diagnosis not present

## 2022-08-10 DIAGNOSIS — R262 Difficulty in walking, not elsewhere classified: Secondary | ICD-10-CM | POA: Diagnosis not present

## 2022-08-10 DIAGNOSIS — G309 Alzheimer's disease, unspecified: Secondary | ICD-10-CM | POA: Diagnosis not present

## 2022-08-10 DIAGNOSIS — M6281 Muscle weakness (generalized): Secondary | ICD-10-CM | POA: Diagnosis not present

## 2022-08-10 DIAGNOSIS — R296 Repeated falls: Secondary | ICD-10-CM | POA: Diagnosis not present

## 2022-08-14 DIAGNOSIS — R296 Repeated falls: Secondary | ICD-10-CM | POA: Diagnosis not present

## 2022-08-14 DIAGNOSIS — M6281 Muscle weakness (generalized): Secondary | ICD-10-CM | POA: Diagnosis not present

## 2022-08-14 DIAGNOSIS — R262 Difficulty in walking, not elsewhere classified: Secondary | ICD-10-CM | POA: Diagnosis not present

## 2022-08-14 DIAGNOSIS — G309 Alzheimer's disease, unspecified: Secondary | ICD-10-CM | POA: Diagnosis not present

## 2022-08-15 DIAGNOSIS — R262 Difficulty in walking, not elsewhere classified: Secondary | ICD-10-CM | POA: Diagnosis not present

## 2022-08-15 DIAGNOSIS — M6281 Muscle weakness (generalized): Secondary | ICD-10-CM | POA: Diagnosis not present

## 2022-08-15 DIAGNOSIS — R296 Repeated falls: Secondary | ICD-10-CM | POA: Diagnosis not present

## 2022-08-15 DIAGNOSIS — G309 Alzheimer's disease, unspecified: Secondary | ICD-10-CM | POA: Diagnosis not present

## 2022-08-16 DIAGNOSIS — M6281 Muscle weakness (generalized): Secondary | ICD-10-CM | POA: Diagnosis not present

## 2022-08-16 DIAGNOSIS — G309 Alzheimer's disease, unspecified: Secondary | ICD-10-CM | POA: Diagnosis not present

## 2022-08-17 DIAGNOSIS — R296 Repeated falls: Secondary | ICD-10-CM | POA: Diagnosis not present

## 2022-08-17 DIAGNOSIS — M6281 Muscle weakness (generalized): Secondary | ICD-10-CM | POA: Diagnosis not present

## 2022-08-17 DIAGNOSIS — G309 Alzheimer's disease, unspecified: Secondary | ICD-10-CM | POA: Diagnosis not present

## 2022-08-17 DIAGNOSIS — R262 Difficulty in walking, not elsewhere classified: Secondary | ICD-10-CM | POA: Diagnosis not present

## 2022-08-21 DIAGNOSIS — R262 Difficulty in walking, not elsewhere classified: Secondary | ICD-10-CM | POA: Diagnosis not present

## 2022-08-21 DIAGNOSIS — E039 Hypothyroidism, unspecified: Secondary | ICD-10-CM | POA: Diagnosis not present

## 2022-08-21 DIAGNOSIS — G309 Alzheimer's disease, unspecified: Secondary | ICD-10-CM | POA: Diagnosis not present

## 2022-08-21 DIAGNOSIS — R296 Repeated falls: Secondary | ICD-10-CM | POA: Diagnosis not present

## 2022-08-21 DIAGNOSIS — F419 Anxiety disorder, unspecified: Secondary | ICD-10-CM | POA: Diagnosis not present

## 2022-08-21 DIAGNOSIS — F03B18 Unspecified dementia, moderate, with other behavioral disturbance: Secondary | ICD-10-CM | POA: Diagnosis not present

## 2022-08-21 DIAGNOSIS — Z0000 Encounter for general adult medical examination without abnormal findings: Secondary | ICD-10-CM | POA: Diagnosis not present

## 2022-08-21 DIAGNOSIS — M6281 Muscle weakness (generalized): Secondary | ICD-10-CM | POA: Diagnosis not present

## 2022-08-21 DIAGNOSIS — Z23 Encounter for immunization: Secondary | ICD-10-CM | POA: Diagnosis not present

## 2022-08-21 DIAGNOSIS — I10 Essential (primary) hypertension: Secondary | ICD-10-CM | POA: Diagnosis not present

## 2022-08-23 DIAGNOSIS — M6281 Muscle weakness (generalized): Secondary | ICD-10-CM | POA: Diagnosis not present

## 2022-08-23 DIAGNOSIS — R296 Repeated falls: Secondary | ICD-10-CM | POA: Diagnosis not present

## 2022-08-23 DIAGNOSIS — R262 Difficulty in walking, not elsewhere classified: Secondary | ICD-10-CM | POA: Diagnosis not present

## 2022-08-23 DIAGNOSIS — G309 Alzheimer's disease, unspecified: Secondary | ICD-10-CM | POA: Diagnosis not present

## 2022-08-24 DIAGNOSIS — F331 Major depressive disorder, recurrent, moderate: Secondary | ICD-10-CM | POA: Diagnosis not present

## 2022-08-24 DIAGNOSIS — G309 Alzheimer's disease, unspecified: Secondary | ICD-10-CM | POA: Diagnosis not present

## 2022-08-24 DIAGNOSIS — F03B3 Unspecified dementia, moderate, with mood disturbance: Secondary | ICD-10-CM | POA: Diagnosis not present

## 2022-08-24 DIAGNOSIS — M79674 Pain in right toe(s): Secondary | ICD-10-CM | POA: Diagnosis not present

## 2022-08-24 DIAGNOSIS — R296 Repeated falls: Secondary | ICD-10-CM | POA: Diagnosis not present

## 2022-08-24 DIAGNOSIS — M79675 Pain in left toe(s): Secondary | ICD-10-CM | POA: Diagnosis not present

## 2022-08-24 DIAGNOSIS — G301 Alzheimer's disease with late onset: Secondary | ICD-10-CM | POA: Diagnosis not present

## 2022-08-24 DIAGNOSIS — B351 Tinea unguium: Secondary | ICD-10-CM | POA: Diagnosis not present

## 2022-08-24 DIAGNOSIS — F419 Anxiety disorder, unspecified: Secondary | ICD-10-CM | POA: Diagnosis not present

## 2022-08-24 DIAGNOSIS — R262 Difficulty in walking, not elsewhere classified: Secondary | ICD-10-CM | POA: Diagnosis not present

## 2022-08-24 DIAGNOSIS — M6281 Muscle weakness (generalized): Secondary | ICD-10-CM | POA: Diagnosis not present

## 2022-08-25 ENCOUNTER — Emergency Department (HOSPITAL_COMMUNITY): Payer: Medicare Other

## 2022-08-25 ENCOUNTER — Encounter (HOSPITAL_COMMUNITY): Payer: Self-pay | Admitting: Emergency Medicine

## 2022-08-25 ENCOUNTER — Other Ambulatory Visit: Payer: Self-pay

## 2022-08-25 ENCOUNTER — Emergency Department (HOSPITAL_COMMUNITY)
Admission: EM | Admit: 2022-08-25 | Discharge: 2022-08-25 | Disposition: A | Discharge status: Skilled Nursing Facility | Payer: Medicare Other | Attending: Emergency Medicine | Admitting: Emergency Medicine

## 2022-08-25 DIAGNOSIS — S01411A Laceration without foreign body of right cheek and temporomandibular area, initial encounter: Secondary | ICD-10-CM | POA: Diagnosis not present

## 2022-08-25 DIAGNOSIS — R079 Chest pain, unspecified: Secondary | ICD-10-CM | POA: Diagnosis not present

## 2022-08-25 DIAGNOSIS — M25521 Pain in right elbow: Secondary | ICD-10-CM | POA: Diagnosis not present

## 2022-08-25 DIAGNOSIS — S0990XA Unspecified injury of head, initial encounter: Secondary | ICD-10-CM | POA: Diagnosis not present

## 2022-08-25 DIAGNOSIS — S0181XA Laceration without foreign body of other part of head, initial encounter: Secondary | ICD-10-CM | POA: Diagnosis not present

## 2022-08-25 DIAGNOSIS — S51011A Laceration without foreign body of right elbow, initial encounter: Secondary | ICD-10-CM | POA: Insufficient documentation

## 2022-08-25 DIAGNOSIS — R4182 Altered mental status, unspecified: Secondary | ICD-10-CM | POA: Diagnosis not present

## 2022-08-25 DIAGNOSIS — Y92129 Unspecified place in nursing home as the place of occurrence of the external cause: Secondary | ICD-10-CM | POA: Diagnosis not present

## 2022-08-25 DIAGNOSIS — Z7401 Bed confinement status: Secondary | ICD-10-CM | POA: Diagnosis not present

## 2022-08-25 DIAGNOSIS — W19XXXA Unspecified fall, initial encounter: Secondary | ICD-10-CM | POA: Insufficient documentation

## 2022-08-25 DIAGNOSIS — S0003XA Contusion of scalp, initial encounter: Secondary | ICD-10-CM | POA: Diagnosis not present

## 2022-08-25 DIAGNOSIS — Z043 Encounter for examination and observation following other accident: Secondary | ICD-10-CM | POA: Diagnosis not present

## 2022-08-25 DIAGNOSIS — R58 Hemorrhage, not elsewhere classified: Secondary | ICD-10-CM | POA: Diagnosis not present

## 2022-08-25 DIAGNOSIS — M47812 Spondylosis without myelopathy or radiculopathy, cervical region: Secondary | ICD-10-CM | POA: Diagnosis not present

## 2022-08-25 DIAGNOSIS — F0390 Unspecified dementia without behavioral disturbance: Secondary | ICD-10-CM | POA: Diagnosis not present

## 2022-08-25 DIAGNOSIS — S0993XA Unspecified injury of face, initial encounter: Secondary | ICD-10-CM | POA: Diagnosis present

## 2022-08-25 DIAGNOSIS — R102 Pelvic and perineal pain: Secondary | ICD-10-CM | POA: Diagnosis not present

## 2022-08-25 DIAGNOSIS — S0591XA Unspecified injury of right eye and orbit, initial encounter: Secondary | ICD-10-CM | POA: Diagnosis not present

## 2022-08-25 HISTORY — DX: Unspecified dementia, unspecified severity, without behavioral disturbance, psychotic disturbance, mood disturbance, and anxiety: F03.90

## 2022-08-25 LAB — COMPREHENSIVE METABOLIC PANEL
ALT: 7 U/L (ref 0–44)
AST: 13 U/L — ABNORMAL LOW (ref 15–41)
Albumin: 2.9 g/dL — ABNORMAL LOW (ref 3.5–5.0)
Alkaline Phosphatase: 63 U/L (ref 38–126)
Anion gap: 9 (ref 5–15)
BUN: 17 mg/dL (ref 8–23)
CO2: 24 mmol/L (ref 22–32)
Calcium: 8.4 mg/dL — ABNORMAL LOW (ref 8.9–10.3)
Chloride: 105 mmol/L (ref 98–111)
Creatinine, Ser: 1.03 mg/dL — ABNORMAL HIGH (ref 0.44–1.00)
GFR, Estimated: 50 mL/min — ABNORMAL LOW (ref >60)
Glucose, Bld: 92 mg/dL (ref 70–99)
Potassium: 3.7 mmol/L (ref 3.5–5.1)
Sodium: 138 mmol/L (ref 135–145)
Total Bilirubin: 0.4 mg/dL (ref 0.3–1.2)
Total Protein: 5.7 g/dL — ABNORMAL LOW (ref 6.5–8.1)

## 2022-08-25 LAB — URINALYSIS, ROUTINE W REFLEX MICROSCOPIC
Bilirubin Urine: NEGATIVE
Glucose, UA: NEGATIVE mg/dL
Ketones, ur: NEGATIVE mg/dL
Nitrite: NEGATIVE
Protein, ur: NEGATIVE mg/dL
Specific Gravity, Urine: 1.011 (ref 1.005–1.030)
WBC, UA: >50 WBC/hpf — ABNORMAL HIGH (ref 0–5)
pH: 6 (ref 5.0–8.0)

## 2022-08-25 LAB — CBC WITH DIFFERENTIAL/PLATELET
Abs Immature Granulocytes: 0.04 10*3/uL (ref 0.00–0.07)
Basophils Absolute: 0 10*3/uL (ref 0.0–0.1)
Basophils Relative: 0 %
Eosinophils Absolute: 0.1 10*3/uL (ref 0.0–0.5)
Eosinophils Relative: 1 %
HCT: 29.7 % — ABNORMAL LOW (ref 36.0–46.0)
Hemoglobin: 9.7 g/dL — ABNORMAL LOW (ref 12.0–15.0)
Immature Granulocytes: 1 %
Lymphocytes Relative: 14 %
Lymphs Abs: 0.8 10*3/uL (ref 0.7–4.0)
MCH: 29.1 pg (ref 26.0–34.0)
MCHC: 32.7 g/dL (ref 30.0–36.0)
MCV: 89.2 fL (ref 80.0–100.0)
Monocytes Absolute: 0.4 10*3/uL (ref 0.1–1.0)
Monocytes Relative: 8 %
Neutro Abs: 4.5 10*3/uL (ref 1.7–7.7)
Neutrophils Relative %: 76 %
Platelets: 238 10*3/uL (ref 150–400)
RBC: 3.33 MIL/uL — ABNORMAL LOW (ref 3.87–5.11)
RDW: 14.1 % (ref 11.5–15.5)
WBC: 5.9 10*3/uL (ref 4.0–10.5)
nRBC: 0 % (ref 0.0–0.2)

## 2022-08-25 LAB — CK: Total CK: 35 U/L — ABNORMAL LOW (ref 38–234)

## 2022-08-25 MED ORDER — LIDOCAINE-EPINEPHRINE (PF) 2 %-1:200000 IJ SOLN
10.0000 mL | Freq: Once | INTRAMUSCULAR | Status: AC
  Administered 2022-08-25: 10 mL
  Filled 2022-08-25: qty 20

## 2022-08-25 MED ORDER — HALOPERIDOL LACTATE 5 MG/ML IJ SOLN
1.0000 mg | Freq: Once | INTRAMUSCULAR | Status: DC
  Filled 2022-08-25: qty 1

## 2022-08-25 MED ORDER — CEPHALEXIN 500 MG PO CAPS: 500.0000 mg | ORAL_CAPSULE | Freq: Four times a day (QID) | ORAL | 0 refills | Status: AC

## 2022-08-25 MED ORDER — CIPROFLOXACIN IN D5W 400 MG/200ML IV SOLN
400.0000 mg | Freq: Once | INTRAVENOUS | Status: DC
Start: 2022-08-25 — End: 2022-08-25

## 2022-08-25 MED ORDER — CEPHALEXIN 250 MG PO CAPS
250.0000 mg | ORAL_CAPSULE | Freq: Once | ORAL | Status: AC
  Administered 2022-08-25: 250 mg via ORAL
  Filled 2022-08-25: qty 1

## 2022-08-25 MED ORDER — HALOPERIDOL LACTATE 5 MG/ML IJ SOLN
1.0000 mg | Freq: Once | INTRAMUSCULAR | Status: AC
  Administered 2022-08-25: 1 mg via INTRAMUSCULAR

## 2022-08-25 NOTE — ED Notes (Signed)
Mittens applied to pt to prevent pt from further scratching open wounds on forehead and removing monitoring equipment. Pt's forehead cleaned up again.

## 2022-08-25 NOTE — ED Provider Notes (Addendum)
Accepted handoff at shift change from Suella Broad PA-C. Please see prior provider note for more detail.   Briefly: Patient is 88 y.o. fall at nursing facility. Patient has dementia and cannot recall what happened. No one witnessed. Swelling and injuries and skin tear to right elbow.  DDX: concern for rhabdomyolisis, UTI, intracranial injury, facial fracture, and c-spine injury  Plan: Pending ct scans and xrays. Wound care. If scans are ok, can be discharged back to her facility   Physical Exam  BP (!) 179/80   Pulse 65   Temp 97.8 F (36.6 C) (Rectal)   Resp 12   SpO2 100%   Physical Exam  Procedures  Procedures  ED Course / MDM   Clinical Course as of 08/25/22 0830  Fri Aug 25, 2022  0829 Patient actively trying to climb out of bed and ripping off bandages.  Given Haldol x1 [JR]    Clinical Course User Index [JR] Harriet Pho, PA-C   Medical Decision Making Amount and/or Complexity of Data Reviewed Labs: ordered. Radiology: ordered.  Risk Prescription drug management.    Dispo: Scans were negative. Patient trying to climb out of bed and agitated. Given Haldol x1. UA revealed concern for UTI. Treated with keflex. Discharged to facility.      Harriet Pho, PA-C 08/25/22 0826    Harriet Pho, PA-C 08/25/22 1674    Fatima Blank, MD 08/25/22 [phone]

## 2022-08-25 NOTE — ED Provider Notes (Signed)
[facility] EMERGENCY DEPARTMENT Provider Note   CSN: [id number] Arrival date & time: 08/25/22  0500     History  Chief Complaint  Patient presents with   Fall    Head Injury    Karen Pearson is a 88 y.o. female.  76 female brought in by EMS transport from nursing facility where patient was found on the ground by staff this morning, not on thinners.  History of dementia, unable to recall events.  Found to have right-sided facial lacerations with skin tear to right elbow.       Home Medications Prior to Admission medications   Medication Sig Start Date End Date Taking? Authorizing Provider  acetaminophen (TYLENOL) 500 MG tablet Take 500 mg by mouth every 6 (six) hours as needed for mild pain, moderate pain or fever.    [provider]  HYDROcodone-acetaminophen (NORCO/VICODIN) 5-325 MG tablet Take 1-2 tablets by mouth every 4 (four) hours as needed for moderate pain (pain score 4-6). Patient not taking: Reported on 07/18/2022 01/12/22   Dessa Phi, DO  hydrOXYzine (VISTARIL) 25 MG capsule Take 25 mg by mouth at bedtime. Patient not taking: Reported on 07/18/2022 12/26/21   [provider]  pantoprazole (PROTONIX) 20 MG tablet Take 20 mg by mouth daily.    [provider]  risperiDONE (RISPERDAL) 0.5 MG tablet Take 0.5 mg by mouth at bedtime.    [provider]  VITAMIN E PO Take 1 capsule by mouth daily. Patient not taking: Reported on 07/18/2022    [provider]      Allergies    Acetaminophen    Review of Systems   Review of Systems Level 5 caveat for dementia Physical Exam Updated Vital Signs BP (!) 179/80   Pulse 65   Temp 97.8 F (36.6 C) (Rectal)   Resp 12   SpO2 100%  Physical Exam Vitals and nursing note reviewed.  Constitutional:      Appearance: She is not ill-appearing or toxic-appearing.  HENT:     Head: Normocephalic.     Comments: Hematoma to right forehead with several  superficial/skin tears noted.  Does have laceration to right maxillary area.    Nose: Nose normal.     Mouth/Throat:     Mouth: Mucous membranes are dry.  Eyes:     Pupils: Pupils are equal, round, and reactive to light.  Cardiovascular:     Rate and Rhythm: Normal rate and regular rhythm.     Heart sounds: Normal heart sounds.  Pulmonary:     Effort: Pulmonary effort is normal.     Breath sounds: Normal breath sounds.  Abdominal:     Palpations: Abdomen is soft.     Tenderness: There is no abdominal tenderness.  Musculoskeletal:        General: Swelling and tenderness present.     Cervical back: Neck supple. No tenderness or bony tenderness.     Thoracic back: No tenderness or bony tenderness.     Lumbar back: No tenderness or bony tenderness.     Comments: Skin tear to right elbow, pain with range of motion without deformity.  Radial pulse present, sensation intact. No pain with palpation through neck or back.  No pain with range of motion of hips or knees.  Skin:    General: Skin is warm and dry.     Findings: No erythema or rash.  Neurological:     General: No focal deficit present.     Mental Status:  She is alert.  Psychiatric:        Behavior: Behavior normal.     ED Results / Procedures / Treatments   Labs (all labs ordered are listed, but only abnormal results are displayed) Labs Reviewed  CBC WITH DIFFERENTIAL/PLATELET  COMPREHENSIVE METABOLIC PANEL  URINALYSIS, ROUTINE W REFLEX MICROSCOPIC  CK    EKG None  Radiology No results found.  Procedures .[name] KitchenLaceration Repair  Date/Time: 08/25/2022 6:38 AM  Performed by: Tacy Learn, PA-C Authorized by: Tacy Learn, PA-C   Universal protocol:    Patient identity confirmed:  Arm band Anesthesia:    Anesthesia method:  Local infiltration   Local anesthetic:  Lidocaine 2% WITH epi Laceration details:    Location:  Face   Face location:  R cheek   Length (cm):  2.5   Depth (mm):  3 Exploration:     Wound extent: no signs of injury     Contaminated: no   Treatment:    Area cleansed with:  Saline   Amount of cleaning:  Standard   Irrigation solution:  Sterile saline Skin repair:    Repair method:  Tissue adhesive Approximation:    Approximation:  Close Repair type:    Repair type:  Simple Post-procedure details:    Procedure completion:  Tolerated with difficulty Comments:     Did not tolerate anesthetic, changed to dermabond instead of sutures.      Medications Ordered in ED Medications  lidocaine-EPINEPHrine (XYLOCAINE W/EPI) 2 %-1:200000 (PF) injection 10 mL (10 mLs Infiltration Given 08/25/22 0602)    ED Course/ Medical Decision Making/ A&P                           Medical Decision Making Amount and/or Complexity of Data Reviewed Labs: ordered. Radiology: ordered.  Risk Prescription drug management.   This patient presents to the ED for concern of unwitnessed fall, this involves an extensive number of treatment options, and is a complaint that carries with it a high risk of complications and morbidity.  The differential diagnosis includes but not limited to rhabdomyolysis, urinary tract infection, intracranial injury, facial fracture, C-spine injury.   Co morbidities that complicate the patient evaluation  Hypertension, hyperlipidemia, dementia, chronic diastolic heart failure   Additional history obtained:  Additional history obtained from EMS who reports patient was found by facility on floor, unwitnessed fall, patient unable to provide history  Lab Tests:  Labs pending at time of sign out   Imaging Studies ordered:  I ordered imaging studies including portable chest x-ray, portable pelvis, elbow x-ray (right).  CT head, C-spine, maxillofacial Pending at time of sign out.   Problem List / ED Course / Critical interventions / Medication management  88 year old female brought in by EMS from nursing facility where she was found on the ground,  unwitnessed fall.  She does have a laceration to the right side of her face with boggy hematoma over right forehead.  She is alert to person, able to follow commands.  Has minimal pain with palpation range of motion of the right elbow where she has a small skin tear.  There is no pain with range of motion or palpation of her lower extremities or left extremity.  No neck or spine tenderness.  Her abdomen is soft nontender.  Plan is to obtain labs to rule out urinary tract infection and rhabdomyolysis or other cause for fall.  We will also complete CT head, neck, maxillofacial.  I have reviewed the patients home medicines and have made adjustments as needed   Social Determinants of Health:  Lives at nursing facility   Test / Admission - Considered:  Disposition pending at time of sign out to oncoming provider          Final Clinical Impression(s) / ED Diagnoses Final diagnoses:  None    Rx / DC Orders ED Discharge Orders     None         Roque Lias 08/25/22 [id number]    Fatima Blank, MD 08/25/22 1824

## 2022-08-25 NOTE — ED Triage Notes (Addendum)
Patient arrived with PTAR from [place] home found by staff on the floor this morning , unwitnessed fall , CBG= 114, not taking anticoagulant , presents with right facial swelling , lacerations at right upper cheek / right forehead  with bruises and skin tears at right forehead and right elbow. Senile dementia  / unable to recall incident . Patient stated neck pain .

## 2022-08-25 NOTE — ED Notes (Signed)
Bed alarm sounded and pt had scooted to the foot of the bed. Pt boosted up in the bed. Pt agitated about having mitts on her hands. Notified EDP and primary RN

## 2022-08-25 NOTE — Discharge Instructions (Addendum)
Evaluation for your fall was overall reassuring. CT scans and xrays were negative for acute injury.  Urinalysis revealed that you do have a UTI.  Treating with Keflex which is an antibiotic.  Please take the entire course as prescribed.  If you have new facial droop, changes in gait, new numbness or tingling of the extremities, new chest pain or shortness of breath or abdominal pain please return to the emergency department for further evaluation.  Otherwise, recommend that you follow-up with your for reevaluation after your fall.

## 2022-08-25 NOTE — ED Notes (Signed)
Yellow socks placed on pt and bed alarm placed under pt. Pt had removed her gown and scratched open her skin tears to her R forehead that had been previously glued. Pt was cleaned up, used the bed pan and new gown applied. CN aware of pt being on a bed alarm

## 2022-08-28 DIAGNOSIS — M6281 Muscle weakness (generalized): Secondary | ICD-10-CM | POA: Diagnosis not present

## 2022-08-28 DIAGNOSIS — R54 Age-related physical debility: Secondary | ICD-10-CM | POA: Diagnosis not present

## 2022-08-28 DIAGNOSIS — R262 Difficulty in walking, not elsewhere classified: Secondary | ICD-10-CM | POA: Diagnosis not present

## 2022-08-28 DIAGNOSIS — G309 Alzheimer's disease, unspecified: Secondary | ICD-10-CM | POA: Diagnosis not present

## 2022-08-28 DIAGNOSIS — G301 Alzheimer's disease with late onset: Secondary | ICD-10-CM | POA: Diagnosis not present

## 2022-08-28 DIAGNOSIS — I10 Essential (primary) hypertension: Secondary | ICD-10-CM | POA: Diagnosis not present

## 2022-08-28 DIAGNOSIS — R296 Repeated falls: Secondary | ICD-10-CM | POA: Diagnosis not present

## 2022-08-29 DIAGNOSIS — R296 Repeated falls: Secondary | ICD-10-CM | POA: Diagnosis not present

## 2022-08-29 DIAGNOSIS — M6281 Muscle weakness (generalized): Secondary | ICD-10-CM | POA: Diagnosis not present

## 2022-08-29 DIAGNOSIS — R262 Difficulty in walking, not elsewhere classified: Secondary | ICD-10-CM | POA: Diagnosis not present

## 2022-08-29 DIAGNOSIS — G309 Alzheimer's disease, unspecified: Secondary | ICD-10-CM | POA: Diagnosis not present

## 2022-08-30 DIAGNOSIS — M6281 Muscle weakness (generalized): Secondary | ICD-10-CM | POA: Diagnosis not present

## 2022-08-30 DIAGNOSIS — G309 Alzheimer's disease, unspecified: Secondary | ICD-10-CM | POA: Diagnosis not present

## 2022-08-31 ENCOUNTER — Non-Acute Institutional Stay: Payer: Medicare Other | Admitting: Family Medicine

## 2022-08-31 VITALS — BP 138/76 | HR 81 | Resp 18

## 2022-08-31 DIAGNOSIS — F03918 Unspecified dementia, unspecified severity, with other behavioral disturbance: Secondary | ICD-10-CM

## 2022-08-31 DIAGNOSIS — Z515 Encounter for palliative care: Secondary | ICD-10-CM

## 2022-08-31 DIAGNOSIS — G309 Alzheimer's disease, unspecified: Secondary | ICD-10-CM | POA: Diagnosis not present

## 2022-08-31 DIAGNOSIS — Z559 Problems related to education and literacy, unspecified: Secondary | ICD-10-CM | POA: Diagnosis not present

## 2022-08-31 DIAGNOSIS — R296 Repeated falls: Secondary | ICD-10-CM

## 2022-08-31 DIAGNOSIS — M6281 Muscle weakness (generalized): Secondary | ICD-10-CM | POA: Diagnosis not present

## 2022-08-31 DIAGNOSIS — R262 Difficulty in walking, not elsewhere classified: Secondary | ICD-10-CM | POA: Diagnosis not present

## 2022-08-31 DIAGNOSIS — Z599 Problem related to housing and economic circumstances, unspecified: Secondary | ICD-10-CM

## 2022-09-01 ENCOUNTER — Encounter: Payer: Self-pay | Admitting: Family Medicine

## 2022-09-01 DIAGNOSIS — Z599 Problem related to housing and economic circumstances, unspecified: Secondary | ICD-10-CM | POA: Insufficient documentation

## 2022-09-01 NOTE — Progress Notes (Signed)
[facility] Pall[facility] Consult Note Telephone: [phone]  Fax: [phone]    Date of encounter: 09/01/22 2:53 PM PATIENT NAME: Karen Pearson [address] [place] [address]   [phone] (home)  DOB: May 02, 1925 MRN: [id number] PRIMARY CARE PROVIDER:    Nelia Shi, MD,  [place] [address] [phone]  [place] PROVIDER:   Nelia Shi, MD [address] [place],  [place] [address] [phone]  RESPONSIBLE PARTY:    Contact Information     Name Relation Home Work Mobile   Karen Pearson Spouse [phone]     Karen Pearson Daughter [phone]     Karen Pearson   [phone]        I met face to face with patient and spouse in her assisted living memory care facility. Palliative Care was asked to follow this patient by consultation request of  Karen Shi, MD to address advance care planning and complex medical decision making. This is a follow up visit   ASSESSMENT , SYMPTOM MANAGEMENT AND PLAN / RECOMMENDATIONS:   Dementia FAST 7 score 6e Minor weight loss Has had 2 significant falls this year, 1 in March 2023 with hip fracture and surgical repair, the second 08/25/22 where she had a significant blunt trauma to the head and forearms.  2.  Frequent falls Using chair alarm to help notify staff when attempting to stand. Continue PT Would recommend cleaning abrasions/scabbed areas of bilateral arms with soap and water, pat dry and apply vaseline gauze, non-stick Telfa and wrap in Kerlix.  3.  Palliative Care Encounter Discussed wishes with spouse. Spouse wants to look at taking her home or placing in different facility.  Advised spouse that as she continues to decline in function that this will most likely make it hard for him to care for her without help. Created DNR/MOST  4.  Inadequate [employer] He is accessing their savings monthly to pay for her  care. SW referral to address if LTC medicaid application could be helpful and/or to discuss with PCP new [facility] for referral to alternate facility.    Advance Care Planning/Goals of Care: Goals include to maximize quality of life and symptom management. Health care surrogate gave his permission to discuss. Our advance care planning conversation included a discussion about:    Exploration of personal, cultural or spiritual beliefs that might influence medical decisions-spouse states that their daughter has lung cancer, works and is not able to be present around patient much.  He thinks she has "suffered enough" and would not want to extend her life at the cost of her suffering more.  Exploration of goals of care in the event of a sudden injury or illness  Identification of a healthcare agent-spouse Karen Pearson Review and updating or creation of an advance directive document . Decision not to resuscitate or to de-escalate disease focused treatments due to poor prognosis. CODE STATUS: DNR MOST 08/31/22: DNR/DNI with comfort care Use of antibiotics on a case by case, time limited basis No IV fluids or feeding tube.      Follow up Palliative Care Visit: Palliative care will continue to follow for complex medical decision making, advance care planning, and clarification of goals. Return 4 weeks or prn.    This visit was coded based on medical decision making (MDM).  PPS: 40%  HOSPICE ELIGIBILITY/DIAGNOSIS: TBD  Chief Complaint:  Palliative Care is continuing to follow patient for chronic medical management in setting of dementia with recent unwitnessed fall.  Palliative  Care is also  assisting with advanced care planning, refining and defining goals of care.   HISTORY OF PRESENT ILLNESS:  Karen Pearson is a 88 y.o. year old female with chronic diastolic CHF, hypothyroidism, hx of recent femoral neck fracture on right with debility, HLD and Hypothyroidism with frequent falls and normocytic anemia  in setting of dementia. Met with patient and husband in the facility.  Pt was sitting up in [place] in the dining room.  Pt has significant ecchymoses of right side of forehead, face and chin from an unwitnessed fall on 08/25/22 while pt was on anticoagulation.  She initially had headache but currently denies headache, worsening of her vision, nausea or vomiting.  She has had no further falls.  Spouse visits daily, washes her clothes.  He states that although he understands that people at the facility have problems with understanding appropriate behavior because of their illness, there has been a female patient who has gone in patient's room before and had to be helped out which has created a sensation of fear for the patient that is interrupting her sleep at night.  Husband is anxious to take her home and feels like he can manage her there.  He does the household chores, feeds her.  He also states that the fees for the facility takes up both their social security incomes so he has to actually dip into savings to pay.  Voiced no knowledge of application to Medicaid for long term care Medicaid to help with patient's cost.  He wants to take her home or move her to another facility where she has been previously. Pt has multiple scabbed areas on her forearms that she is picking at and has been working with PT on her gait.  Previously PT states that pt was able to ambulate with her walker but currently pt has very poor balance and unsteady gait. PT states she has improved but currently is using WC.  Pt was also noted on ED visit on 08/25/22 s/p fall to have a UTI and was started on Cephalexin. Pt has to have help bathing and dressing.  On 08/25/22 pt was found on the floor and noted to have hit her head.  Maxillofacial CT, head and neck CT did not show any acute bleeds, fractures. Urine was significant for potential UTI and pt was sent to the facility on Cephalexin.    History obtained from review of EMR, discussion with  family, facility staff and/or Karen Pearson.   I reviewed EMR for available labs, medications, imaging, studies and related documents.  There are no new records since last visit/Records reviewed and summarized above.   ROS-majority of info provided by spouse General: NAD EYES: denies vision changes ENMT: endorses some throat clearing but no choking or coughing after eating or drinking Cardiovascular: denies chest pain, denies DOE Pulmonary: denies cough, denies increased SOB Abdomen: endorses "fair" appetite, denies constipation, GU: denies dysuria, endorses incontinence of urine MSK:  endorses generalized weakness/fatigue, severe fall recently without serious injury Skin: scabbed areas of bilateral forearms  Neurological: denies pain, endorses insomnia Psych: Endorses anxious mood Heme/lymph/immuno: noted bruising of left forearm, abnormal bleeding  Physical Exam: Current and past weights: 124.74 lbs on 02/25/22 and most recent weight was 121 lbs. Constitutional: NAD General: frail appearing Head: significant yellow-brown facial ecchymosis from right forehead to submandibular area of right side of face  Eyes:  2-3 cm superficial semicircular laceration under right eye ENMT: hard of hearing CV: S1S2, RRR with occasional premature  beat, no LE edema Pulmonary: CTAB, no increased work of breathing, no cough, room air Abdomen: normo-active BS + 4 quadrants, soft and non tender, no ascites GU: deferred MSK: no sarcopenia, moves all extremities, minimally ambulatory Skin: warm and dry, has scabbing of bilateral posterior elbows and forearms Neuro:  noted generalized weakness/poor balance and unsteady gait,  noted cognitive impairment.  PERRL Psych: mildly anxious affect, A and O x 1 Hem/lymph/immuno: noted ecchymoses as per above and on superior left forearm   Thank you for the opportunity to participate in the care of Ms. Satz.  The palliative care team will continue to follow. Please  call our office at [phone] if we can be of additional assistance.   Marijo Conception, FNP -C  COVID-19 PATIENT SCREENING TOOL Asked and negative response unless otherwise noted:   Have you had symptoms of covid, tested positive or been in contact with someone with symptoms/positive test in the past 5-10 days?  No

## 2022-09-04 ENCOUNTER — Telehealth: Payer: Self-pay | Admitting: Family Medicine

## 2022-09-04 DIAGNOSIS — M6281 Muscle weakness (generalized): Secondary | ICD-10-CM | POA: Diagnosis not present

## 2022-09-04 DIAGNOSIS — G301 Alzheimer's disease with late onset: Secondary | ICD-10-CM | POA: Diagnosis not present

## 2022-09-04 DIAGNOSIS — G309 Alzheimer's disease, unspecified: Secondary | ICD-10-CM | POA: Diagnosis not present

## 2022-09-04 DIAGNOSIS — R54 Age-related physical debility: Secondary | ICD-10-CM | POA: Diagnosis not present

## 2022-09-04 DIAGNOSIS — R296 Repeated falls: Secondary | ICD-10-CM | POA: Diagnosis not present

## 2022-09-04 DIAGNOSIS — Z66 Do not resuscitate: Secondary | ICD-10-CM | POA: Diagnosis not present

## 2022-09-04 DIAGNOSIS — I10 Essential (primary) hypertension: Secondary | ICD-10-CM | POA: Diagnosis not present

## 2022-09-04 DIAGNOSIS — E039 Hypothyroidism, unspecified: Secondary | ICD-10-CM | POA: Diagnosis not present

## 2022-09-04 NOTE — Telephone Encounter (Signed)
Returning call to spouse who has questions about his wife. He has given notice to [facility] to have pt discharged to bring her home in 2 weeks.  He states he had been trying to go to [facility] where she had rehab previously.  He states that he was told today there are no beds in [facility] currently.  Their daughter has COPD and lung cancer which he states is really bad.  He says he can get help with her.  She has only wandered to the next door neighbor's home but that is the only time she has ever wandered from her home.  He states pt is begging to go home and he has been her caregiver and doing all her laundry and taking care of their home.  He is aware that she may fall but he feels like he can be with her 24/7.  He states he was contacted by someone with [facility] to get more information from Dr Cari Caraway who is pt's PCP.  Advised him that I would talk to Administrators and Amy with [place]  at [facility] to obtain additional insight into pt's eligibility for Hospice and whether or not Palliative Care could continue to service pt at home. Advised will call him tomorrow at 3:30 pm with an update.  Damaris Hippo FNP-C

## 2022-09-05 ENCOUNTER — Telehealth: Payer: Self-pay | Admitting: Family Medicine

## 2022-09-05 DIAGNOSIS — M6281 Muscle weakness (generalized): Secondary | ICD-10-CM | POA: Diagnosis not present

## 2022-09-05 DIAGNOSIS — G309 Alzheimer's disease, unspecified: Secondary | ICD-10-CM | POA: Diagnosis not present

## 2022-09-05 DIAGNOSIS — R262 Difficulty in walking, not elsewhere classified: Secondary | ICD-10-CM | POA: Diagnosis not present

## 2022-09-05 DIAGNOSIS — R296 Repeated falls: Secondary | ICD-10-CM | POA: Diagnosis not present

## 2022-09-05 NOTE — Telephone Encounter (Signed)
TCT Karen Pearson, pt's spouse.  Advised that Dr Theadore Nan, pt's PCP has given referral for Hospice evaluation.  Advised that if pt deemed ineligible at present time for Hospice that she will remain on home Palliative roster to be seen.  Spouse verbalized appreciation and wants to bring pt home.  Has a neighbor that lost a spouse with dementia who may be able to help at home in addition to pt's 2 nieces from [place]. Advised that Hospice will call to schedule admission visit.  Karen Hippo FNP-C

## 2022-09-05 NOTE — Telephone Encounter (Signed)
Called Dr Abigail Butts McNeill's office as she has been pt's community PCP.  Sent last visit note and asked if she was in agreement that pt is expected to have a prognosis of 6 months or less if she follows her current course.  Office staff answering call indicated request must be made in writing and faxed directly to their office.  Provided fax number by staff and advised if agreement with referral, provided [facility] fax number and asked if MD wants to remain as attending.  Damaris Hippo, FNP-C

## 2022-09-06 DIAGNOSIS — M6281 Muscle weakness (generalized): Secondary | ICD-10-CM | POA: Diagnosis not present

## 2022-09-06 DIAGNOSIS — R262 Difficulty in walking, not elsewhere classified: Secondary | ICD-10-CM | POA: Diagnosis not present

## 2022-09-06 DIAGNOSIS — R296 Repeated falls: Secondary | ICD-10-CM | POA: Diagnosis not present

## 2022-09-06 DIAGNOSIS — G309 Alzheimer's disease, unspecified: Secondary | ICD-10-CM | POA: Diagnosis not present

## 2022-09-07 DIAGNOSIS — F419 Anxiety disorder, unspecified: Secondary | ICD-10-CM | POA: Diagnosis not present

## 2022-09-07 DIAGNOSIS — R262 Difficulty in walking, not elsewhere classified: Secondary | ICD-10-CM | POA: Diagnosis not present

## 2022-09-07 DIAGNOSIS — F331 Major depressive disorder, recurrent, moderate: Secondary | ICD-10-CM | POA: Diagnosis not present

## 2022-09-07 DIAGNOSIS — F0283 Dementia in other diseases classified elsewhere, unspecified severity, with mood disturbance: Secondary | ICD-10-CM | POA: Diagnosis not present

## 2022-09-07 DIAGNOSIS — R634 Abnormal weight loss: Secondary | ICD-10-CM | POA: Diagnosis not present

## 2022-09-07 DIAGNOSIS — G301 Alzheimer's disease with late onset: Secondary | ICD-10-CM | POA: Diagnosis not present

## 2022-09-07 DIAGNOSIS — E785 Hyperlipidemia, unspecified: Secondary | ICD-10-CM | POA: Diagnosis not present

## 2022-09-07 DIAGNOSIS — M6281 Muscle weakness (generalized): Secondary | ICD-10-CM | POA: Diagnosis not present

## 2022-09-07 DIAGNOSIS — D631 Anemia in chronic kidney disease: Secondary | ICD-10-CM | POA: Diagnosis not present

## 2022-09-07 DIAGNOSIS — I130 Hypertensive heart and chronic kidney disease with heart failure and stage 1 through stage 4 chronic kidney disease, or unspecified chronic kidney disease: Secondary | ICD-10-CM | POA: Diagnosis not present

## 2022-09-07 DIAGNOSIS — G309 Alzheimer's disease, unspecified: Secondary | ICD-10-CM | POA: Diagnosis not present

## 2022-09-07 DIAGNOSIS — C439 Malignant melanoma of skin, unspecified: Secondary | ICD-10-CM | POA: Diagnosis not present

## 2022-09-07 DIAGNOSIS — I509 Heart failure, unspecified: Secondary | ICD-10-CM | POA: Diagnosis not present

## 2022-09-07 DIAGNOSIS — F0282 Dementia in other diseases classified elsewhere, unspecified severity, with psychotic disturbance: Secondary | ICD-10-CM | POA: Diagnosis not present

## 2022-09-07 DIAGNOSIS — F03B3 Unspecified dementia, moderate, with mood disturbance: Secondary | ICD-10-CM | POA: Diagnosis not present

## 2022-09-07 DIAGNOSIS — E039 Hypothyroidism, unspecified: Secondary | ICD-10-CM | POA: Diagnosis not present

## 2022-09-07 DIAGNOSIS — K219 Gastro-esophageal reflux disease without esophagitis: Secondary | ICD-10-CM | POA: Diagnosis not present

## 2022-09-07 DIAGNOSIS — R296 Repeated falls: Secondary | ICD-10-CM | POA: Diagnosis not present

## 2022-09-07 DIAGNOSIS — N183 Chronic kidney disease, stage 3 unspecified: Secondary | ICD-10-CM | POA: Diagnosis not present

## 2022-09-07 DIAGNOSIS — T148XXD Other injury of unspecified body region, subsequent encounter: Secondary | ICD-10-CM | POA: Diagnosis not present

## 2022-09-07 DIAGNOSIS — G311 Senile degeneration of brain, not elsewhere classified: Secondary | ICD-10-CM | POA: Diagnosis not present

## 2022-09-08 DIAGNOSIS — R634 Abnormal weight loss: Secondary | ICD-10-CM | POA: Diagnosis not present

## 2022-09-08 DIAGNOSIS — F0283 Dementia in other diseases classified elsewhere, unspecified severity, with mood disturbance: Secondary | ICD-10-CM | POA: Diagnosis not present

## 2022-09-08 DIAGNOSIS — I509 Heart failure, unspecified: Secondary | ICD-10-CM | POA: Diagnosis not present

## 2022-09-08 DIAGNOSIS — F0282 Dementia in other diseases classified elsewhere, unspecified severity, with psychotic disturbance: Secondary | ICD-10-CM | POA: Diagnosis not present

## 2022-09-08 DIAGNOSIS — I130 Hypertensive heart and chronic kidney disease with heart failure and stage 1 through stage 4 chronic kidney disease, or unspecified chronic kidney disease: Secondary | ICD-10-CM | POA: Diagnosis not present

## 2022-09-08 DIAGNOSIS — G311 Senile degeneration of brain, not elsewhere classified: Secondary | ICD-10-CM | POA: Diagnosis not present

## 2022-09-11 DIAGNOSIS — R262 Difficulty in walking, not elsewhere classified: Secondary | ICD-10-CM | POA: Diagnosis not present

## 2022-09-11 DIAGNOSIS — R296 Repeated falls: Secondary | ICD-10-CM | POA: Diagnosis not present

## 2022-09-11 DIAGNOSIS — M6281 Muscle weakness (generalized): Secondary | ICD-10-CM | POA: Diagnosis not present

## 2022-09-11 DIAGNOSIS — G309 Alzheimer's disease, unspecified: Secondary | ICD-10-CM | POA: Diagnosis not present

## 2022-09-12 DIAGNOSIS — G309 Alzheimer's disease, unspecified: Secondary | ICD-10-CM | POA: Diagnosis not present

## 2022-09-12 DIAGNOSIS — F0282 Dementia in other diseases classified elsewhere, unspecified severity, with psychotic disturbance: Secondary | ICD-10-CM | POA: Diagnosis not present

## 2022-09-12 DIAGNOSIS — I130 Hypertensive heart and chronic kidney disease with heart failure and stage 1 through stage 4 chronic kidney disease, or unspecified chronic kidney disease: Secondary | ICD-10-CM | POA: Diagnosis not present

## 2022-09-12 DIAGNOSIS — R634 Abnormal weight loss: Secondary | ICD-10-CM | POA: Diagnosis not present

## 2022-09-12 DIAGNOSIS — G311 Senile degeneration of brain, not elsewhere classified: Secondary | ICD-10-CM | POA: Diagnosis not present

## 2022-09-12 DIAGNOSIS — I509 Heart failure, unspecified: Secondary | ICD-10-CM | POA: Diagnosis not present

## 2022-09-12 DIAGNOSIS — F0283 Dementia in other diseases classified elsewhere, unspecified severity, with mood disturbance: Secondary | ICD-10-CM | POA: Diagnosis not present

## 2022-09-12 DIAGNOSIS — M6281 Muscle weakness (generalized): Secondary | ICD-10-CM | POA: Diagnosis not present

## 2022-09-15 DIAGNOSIS — R296 Repeated falls: Secondary | ICD-10-CM | POA: Diagnosis not present

## 2022-09-15 DIAGNOSIS — I130 Hypertensive heart and chronic kidney disease with heart failure and stage 1 through stage 4 chronic kidney disease, or unspecified chronic kidney disease: Secondary | ICD-10-CM | POA: Diagnosis not present

## 2022-09-15 DIAGNOSIS — R262 Difficulty in walking, not elsewhere classified: Secondary | ICD-10-CM | POA: Diagnosis not present

## 2022-09-15 DIAGNOSIS — G309 Alzheimer's disease, unspecified: Secondary | ICD-10-CM | POA: Diagnosis not present

## 2022-09-15 DIAGNOSIS — I509 Heart failure, unspecified: Secondary | ICD-10-CM | POA: Diagnosis not present

## 2022-09-15 DIAGNOSIS — F0283 Dementia in other diseases classified elsewhere, unspecified severity, with mood disturbance: Secondary | ICD-10-CM | POA: Diagnosis not present

## 2022-09-15 DIAGNOSIS — M6281 Muscle weakness (generalized): Secondary | ICD-10-CM | POA: Diagnosis not present

## 2022-09-15 DIAGNOSIS — F0282 Dementia in other diseases classified elsewhere, unspecified severity, with psychotic disturbance: Secondary | ICD-10-CM | POA: Diagnosis not present

## 2022-09-15 DIAGNOSIS — R634 Abnormal weight loss: Secondary | ICD-10-CM | POA: Diagnosis not present

## 2022-09-15 DIAGNOSIS — G311 Senile degeneration of brain, not elsewhere classified: Secondary | ICD-10-CM | POA: Diagnosis not present

## 2022-09-18 DIAGNOSIS — G309 Alzheimer's disease, unspecified: Secondary | ICD-10-CM | POA: Diagnosis not present

## 2022-09-18 DIAGNOSIS — F0283 Dementia in other diseases classified elsewhere, unspecified severity, with mood disturbance: Secondary | ICD-10-CM | POA: Diagnosis not present

## 2022-09-18 DIAGNOSIS — M6281 Muscle weakness (generalized): Secondary | ICD-10-CM | POA: Diagnosis not present

## 2022-09-18 DIAGNOSIS — I130 Hypertensive heart and chronic kidney disease with heart failure and stage 1 through stage 4 chronic kidney disease, or unspecified chronic kidney disease: Secondary | ICD-10-CM | POA: Diagnosis not present

## 2022-09-18 DIAGNOSIS — R634 Abnormal weight loss: Secondary | ICD-10-CM | POA: Diagnosis not present

## 2022-09-18 DIAGNOSIS — G311 Senile degeneration of brain, not elsewhere classified: Secondary | ICD-10-CM | POA: Diagnosis not present

## 2022-09-18 DIAGNOSIS — I509 Heart failure, unspecified: Secondary | ICD-10-CM | POA: Diagnosis not present

## 2022-09-18 DIAGNOSIS — F0282 Dementia in other diseases classified elsewhere, unspecified severity, with psychotic disturbance: Secondary | ICD-10-CM | POA: Diagnosis not present

## 2022-09-19 DIAGNOSIS — R296 Repeated falls: Secondary | ICD-10-CM | POA: Diagnosis not present

## 2022-09-19 DIAGNOSIS — G309 Alzheimer's disease, unspecified: Secondary | ICD-10-CM | POA: Diagnosis not present

## 2022-09-19 DIAGNOSIS — M6281 Muscle weakness (generalized): Secondary | ICD-10-CM | POA: Diagnosis not present

## 2022-09-19 DIAGNOSIS — R262 Difficulty in walking, not elsewhere classified: Secondary | ICD-10-CM | POA: Diagnosis not present

## 2022-09-20 DIAGNOSIS — G309 Alzheimer's disease, unspecified: Secondary | ICD-10-CM | POA: Diagnosis not present

## 2022-09-20 DIAGNOSIS — R296 Repeated falls: Secondary | ICD-10-CM | POA: Diagnosis not present

## 2022-09-20 DIAGNOSIS — M6281 Muscle weakness (generalized): Secondary | ICD-10-CM | POA: Diagnosis not present

## 2022-09-20 DIAGNOSIS — R262 Difficulty in walking, not elsewhere classified: Secondary | ICD-10-CM | POA: Diagnosis not present

## 2022-09-21 DIAGNOSIS — M6281 Muscle weakness (generalized): Secondary | ICD-10-CM | POA: Diagnosis not present

## 2022-09-21 DIAGNOSIS — G309 Alzheimer's disease, unspecified: Secondary | ICD-10-CM | POA: Diagnosis not present

## 2022-09-22 DIAGNOSIS — E785 Hyperlipidemia, unspecified: Secondary | ICD-10-CM | POA: Diagnosis not present

## 2022-09-22 DIAGNOSIS — D631 Anemia in chronic kidney disease: Secondary | ICD-10-CM | POA: Diagnosis not present

## 2022-09-22 DIAGNOSIS — N183 Chronic kidney disease, stage 3 unspecified: Secondary | ICD-10-CM | POA: Diagnosis not present

## 2022-09-22 DIAGNOSIS — I130 Hypertensive heart and chronic kidney disease with heart failure and stage 1 through stage 4 chronic kidney disease, or unspecified chronic kidney disease: Secondary | ICD-10-CM | POA: Diagnosis not present

## 2022-09-22 DIAGNOSIS — F0282 Dementia in other diseases classified elsewhere, unspecified severity, with psychotic disturbance: Secondary | ICD-10-CM | POA: Diagnosis not present

## 2022-09-22 DIAGNOSIS — C439 Malignant melanoma of skin, unspecified: Secondary | ICD-10-CM | POA: Diagnosis not present

## 2022-09-22 DIAGNOSIS — G311 Senile degeneration of brain, not elsewhere classified: Secondary | ICD-10-CM | POA: Diagnosis not present

## 2022-09-22 DIAGNOSIS — F0283 Dementia in other diseases classified elsewhere, unspecified severity, with mood disturbance: Secondary | ICD-10-CM | POA: Diagnosis not present

## 2022-09-22 DIAGNOSIS — R634 Abnormal weight loss: Secondary | ICD-10-CM | POA: Diagnosis not present

## 2022-09-22 DIAGNOSIS — E039 Hypothyroidism, unspecified: Secondary | ICD-10-CM | POA: Diagnosis not present

## 2022-09-22 DIAGNOSIS — T148XXD Other injury of unspecified body region, subsequent encounter: Secondary | ICD-10-CM | POA: Diagnosis not present

## 2022-09-22 DIAGNOSIS — I509 Heart failure, unspecified: Secondary | ICD-10-CM | POA: Diagnosis not present

## 2022-09-22 DIAGNOSIS — K219 Gastro-esophageal reflux disease without esophagitis: Secondary | ICD-10-CM | POA: Diagnosis not present

## 2022-09-25 DIAGNOSIS — G311 Senile degeneration of brain, not elsewhere classified: Secondary | ICD-10-CM | POA: Diagnosis not present

## 2022-09-25 DIAGNOSIS — I509 Heart failure, unspecified: Secondary | ICD-10-CM | POA: Diagnosis not present

## 2022-09-25 DIAGNOSIS — I130 Hypertensive heart and chronic kidney disease with heart failure and stage 1 through stage 4 chronic kidney disease, or unspecified chronic kidney disease: Secondary | ICD-10-CM | POA: Diagnosis not present

## 2022-09-25 DIAGNOSIS — F0282 Dementia in other diseases classified elsewhere, unspecified severity, with psychotic disturbance: Secondary | ICD-10-CM | POA: Diagnosis not present

## 2022-09-25 DIAGNOSIS — F0283 Dementia in other diseases classified elsewhere, unspecified severity, with mood disturbance: Secondary | ICD-10-CM | POA: Diagnosis not present

## 2022-09-25 DIAGNOSIS — R634 Abnormal weight loss: Secondary | ICD-10-CM | POA: Diagnosis not present

## 2022-09-25 DIAGNOSIS — R262 Difficulty in walking, not elsewhere classified: Secondary | ICD-10-CM | POA: Diagnosis not present

## 2022-09-25 DIAGNOSIS — G309 Alzheimer's disease, unspecified: Secondary | ICD-10-CM | POA: Diagnosis not present

## 2022-09-25 DIAGNOSIS — R296 Repeated falls: Secondary | ICD-10-CM | POA: Diagnosis not present

## 2022-09-25 DIAGNOSIS — M6281 Muscle weakness (generalized): Secondary | ICD-10-CM | POA: Diagnosis not present

## 2022-09-26 DIAGNOSIS — F0283 Dementia in other diseases classified elsewhere, unspecified severity, with mood disturbance: Secondary | ICD-10-CM | POA: Diagnosis not present

## 2022-09-26 DIAGNOSIS — R634 Abnormal weight loss: Secondary | ICD-10-CM | POA: Diagnosis not present

## 2022-09-26 DIAGNOSIS — M6281 Muscle weakness (generalized): Secondary | ICD-10-CM | POA: Diagnosis not present

## 2022-09-26 DIAGNOSIS — F0282 Dementia in other diseases classified elsewhere, unspecified severity, with psychotic disturbance: Secondary | ICD-10-CM | POA: Diagnosis not present

## 2022-09-26 DIAGNOSIS — I509 Heart failure, unspecified: Secondary | ICD-10-CM | POA: Diagnosis not present

## 2022-09-26 DIAGNOSIS — I130 Hypertensive heart and chronic kidney disease with heart failure and stage 1 through stage 4 chronic kidney disease, or unspecified chronic kidney disease: Secondary | ICD-10-CM | POA: Diagnosis not present

## 2022-09-26 DIAGNOSIS — R262 Difficulty in walking, not elsewhere classified: Secondary | ICD-10-CM | POA: Diagnosis not present

## 2022-09-26 DIAGNOSIS — R296 Repeated falls: Secondary | ICD-10-CM | POA: Diagnosis not present

## 2022-09-26 DIAGNOSIS — G309 Alzheimer's disease, unspecified: Secondary | ICD-10-CM | POA: Diagnosis not present

## 2022-09-26 DIAGNOSIS — G311 Senile degeneration of brain, not elsewhere classified: Secondary | ICD-10-CM | POA: Diagnosis not present

## 2022-09-28 DIAGNOSIS — I130 Hypertensive heart and chronic kidney disease with heart failure and stage 1 through stage 4 chronic kidney disease, or unspecified chronic kidney disease: Secondary | ICD-10-CM | POA: Diagnosis not present

## 2022-09-28 DIAGNOSIS — F0283 Dementia in other diseases classified elsewhere, unspecified severity, with mood disturbance: Secondary | ICD-10-CM | POA: Diagnosis not present

## 2022-09-28 DIAGNOSIS — G301 Alzheimer's disease with late onset: Secondary | ICD-10-CM | POA: Diagnosis not present

## 2022-09-28 DIAGNOSIS — G311 Senile degeneration of brain, not elsewhere classified: Secondary | ICD-10-CM | POA: Diagnosis not present

## 2022-09-28 DIAGNOSIS — F0282 Dementia in other diseases classified elsewhere, unspecified severity, with psychotic disturbance: Secondary | ICD-10-CM | POA: Diagnosis not present

## 2022-09-28 DIAGNOSIS — I509 Heart failure, unspecified: Secondary | ICD-10-CM | POA: Diagnosis not present

## 2022-09-28 DIAGNOSIS — F331 Major depressive disorder, recurrent, moderate: Secondary | ICD-10-CM | POA: Diagnosis not present

## 2022-09-28 DIAGNOSIS — F03B3 Unspecified dementia, moderate, with mood disturbance: Secondary | ICD-10-CM | POA: Diagnosis not present

## 2022-09-28 DIAGNOSIS — F419 Anxiety disorder, unspecified: Secondary | ICD-10-CM | POA: Diagnosis not present

## 2022-09-28 DIAGNOSIS — R634 Abnormal weight loss: Secondary | ICD-10-CM | POA: Diagnosis not present

## 2022-09-29 DIAGNOSIS — F0282 Dementia in other diseases classified elsewhere, unspecified severity, with psychotic disturbance: Secondary | ICD-10-CM | POA: Diagnosis not present

## 2022-09-29 DIAGNOSIS — G311 Senile degeneration of brain, not elsewhere classified: Secondary | ICD-10-CM | POA: Diagnosis not present

## 2022-09-29 DIAGNOSIS — F0283 Dementia in other diseases classified elsewhere, unspecified severity, with mood disturbance: Secondary | ICD-10-CM | POA: Diagnosis not present

## 2022-09-29 DIAGNOSIS — F331 Major depressive disorder, recurrent, moderate: Secondary | ICD-10-CM | POA: Diagnosis not present

## 2022-09-29 DIAGNOSIS — F419 Anxiety disorder, unspecified: Secondary | ICD-10-CM | POA: Diagnosis not present

## 2022-09-29 DIAGNOSIS — R634 Abnormal weight loss: Secondary | ICD-10-CM | POA: Diagnosis not present

## 2022-09-29 DIAGNOSIS — I130 Hypertensive heart and chronic kidney disease with heart failure and stage 1 through stage 4 chronic kidney disease, or unspecified chronic kidney disease: Secondary | ICD-10-CM | POA: Diagnosis not present

## 2022-09-29 DIAGNOSIS — I509 Heart failure, unspecified: Secondary | ICD-10-CM | POA: Diagnosis not present

## 2022-09-30 DIAGNOSIS — G311 Senile degeneration of brain, not elsewhere classified: Secondary | ICD-10-CM | POA: Diagnosis not present

## 2022-09-30 DIAGNOSIS — I509 Heart failure, unspecified: Secondary | ICD-10-CM | POA: Diagnosis not present

## 2022-09-30 DIAGNOSIS — F0282 Dementia in other diseases classified elsewhere, unspecified severity, with psychotic disturbance: Secondary | ICD-10-CM | POA: Diagnosis not present

## 2022-09-30 DIAGNOSIS — I130 Hypertensive heart and chronic kidney disease with heart failure and stage 1 through stage 4 chronic kidney disease, or unspecified chronic kidney disease: Secondary | ICD-10-CM | POA: Diagnosis not present

## 2022-09-30 DIAGNOSIS — R634 Abnormal weight loss: Secondary | ICD-10-CM | POA: Diagnosis not present

## 2022-09-30 DIAGNOSIS — F0283 Dementia in other diseases classified elsewhere, unspecified severity, with mood disturbance: Secondary | ICD-10-CM | POA: Diagnosis not present

## 2022-10-02 DIAGNOSIS — R262 Difficulty in walking, not elsewhere classified: Secondary | ICD-10-CM | POA: Diagnosis not present

## 2022-10-02 DIAGNOSIS — I509 Heart failure, unspecified: Secondary | ICD-10-CM | POA: Diagnosis not present

## 2022-10-02 DIAGNOSIS — G311 Senile degeneration of brain, not elsewhere classified: Secondary | ICD-10-CM | POA: Diagnosis not present

## 2022-10-02 DIAGNOSIS — I130 Hypertensive heart and chronic kidney disease with heart failure and stage 1 through stage 4 chronic kidney disease, or unspecified chronic kidney disease: Secondary | ICD-10-CM | POA: Diagnosis not present

## 2022-10-02 DIAGNOSIS — F0282 Dementia in other diseases classified elsewhere, unspecified severity, with psychotic disturbance: Secondary | ICD-10-CM | POA: Diagnosis not present

## 2022-10-02 DIAGNOSIS — G309 Alzheimer's disease, unspecified: Secondary | ICD-10-CM | POA: Diagnosis not present

## 2022-10-02 DIAGNOSIS — M6281 Muscle weakness (generalized): Secondary | ICD-10-CM | POA: Diagnosis not present

## 2022-10-02 DIAGNOSIS — R296 Repeated falls: Secondary | ICD-10-CM | POA: Diagnosis not present

## 2022-10-02 DIAGNOSIS — R634 Abnormal weight loss: Secondary | ICD-10-CM | POA: Diagnosis not present

## 2022-10-02 DIAGNOSIS — F0283 Dementia in other diseases classified elsewhere, unspecified severity, with mood disturbance: Secondary | ICD-10-CM | POA: Diagnosis not present

## 2022-10-03 DIAGNOSIS — R634 Abnormal weight loss: Secondary | ICD-10-CM | POA: Diagnosis not present

## 2022-10-03 DIAGNOSIS — F0282 Dementia in other diseases classified elsewhere, unspecified severity, with psychotic disturbance: Secondary | ICD-10-CM | POA: Diagnosis not present

## 2022-10-03 DIAGNOSIS — I509 Heart failure, unspecified: Secondary | ICD-10-CM | POA: Diagnosis not present

## 2022-10-03 DIAGNOSIS — F0283 Dementia in other diseases classified elsewhere, unspecified severity, with mood disturbance: Secondary | ICD-10-CM | POA: Diagnosis not present

## 2022-10-03 DIAGNOSIS — I130 Hypertensive heart and chronic kidney disease with heart failure and stage 1 through stage 4 chronic kidney disease, or unspecified chronic kidney disease: Secondary | ICD-10-CM | POA: Diagnosis not present

## 2022-10-03 DIAGNOSIS — G311 Senile degeneration of brain, not elsewhere classified: Secondary | ICD-10-CM | POA: Diagnosis not present

## 2022-10-04 DIAGNOSIS — R262 Difficulty in walking, not elsewhere classified: Secondary | ICD-10-CM | POA: Diagnosis not present

## 2022-10-04 DIAGNOSIS — M6281 Muscle weakness (generalized): Secondary | ICD-10-CM | POA: Diagnosis not present

## 2022-10-04 DIAGNOSIS — G309 Alzheimer's disease, unspecified: Secondary | ICD-10-CM | POA: Diagnosis not present

## 2022-10-04 DIAGNOSIS — R296 Repeated falls: Secondary | ICD-10-CM | POA: Diagnosis not present

## 2022-10-05 DIAGNOSIS — G309 Alzheimer's disease, unspecified: Secondary | ICD-10-CM | POA: Diagnosis not present

## 2022-10-05 DIAGNOSIS — M6281 Muscle weakness (generalized): Secondary | ICD-10-CM | POA: Diagnosis not present

## 2022-10-06 DIAGNOSIS — I509 Heart failure, unspecified: Secondary | ICD-10-CM | POA: Diagnosis not present

## 2022-10-06 DIAGNOSIS — F0283 Dementia in other diseases classified elsewhere, unspecified severity, with mood disturbance: Secondary | ICD-10-CM | POA: Diagnosis not present

## 2022-10-06 DIAGNOSIS — F0282 Dementia in other diseases classified elsewhere, unspecified severity, with psychotic disturbance: Secondary | ICD-10-CM | POA: Diagnosis not present

## 2022-10-06 DIAGNOSIS — R634 Abnormal weight loss: Secondary | ICD-10-CM | POA: Diagnosis not present

## 2022-10-06 DIAGNOSIS — G311 Senile degeneration of brain, not elsewhere classified: Secondary | ICD-10-CM | POA: Diagnosis not present

## 2022-10-06 DIAGNOSIS — I130 Hypertensive heart and chronic kidney disease with heart failure and stage 1 through stage 4 chronic kidney disease, or unspecified chronic kidney disease: Secondary | ICD-10-CM | POA: Diagnosis not present

## 2022-10-09 DIAGNOSIS — G311 Senile degeneration of brain, not elsewhere classified: Secondary | ICD-10-CM | POA: Diagnosis not present

## 2022-10-09 DIAGNOSIS — F0282 Dementia in other diseases classified elsewhere, unspecified severity, with psychotic disturbance: Secondary | ICD-10-CM | POA: Diagnosis not present

## 2022-10-09 DIAGNOSIS — F0283 Dementia in other diseases classified elsewhere, unspecified severity, with mood disturbance: Secondary | ICD-10-CM | POA: Diagnosis not present

## 2022-10-09 DIAGNOSIS — R634 Abnormal weight loss: Secondary | ICD-10-CM | POA: Diagnosis not present

## 2022-10-09 DIAGNOSIS — I509 Heart failure, unspecified: Secondary | ICD-10-CM | POA: Diagnosis not present

## 2022-10-09 DIAGNOSIS — I130 Hypertensive heart and chronic kidney disease with heart failure and stage 1 through stage 4 chronic kidney disease, or unspecified chronic kidney disease: Secondary | ICD-10-CM | POA: Diagnosis not present

## 2022-10-10 DIAGNOSIS — G311 Senile degeneration of brain, not elsewhere classified: Secondary | ICD-10-CM | POA: Diagnosis not present

## 2022-10-10 DIAGNOSIS — R634 Abnormal weight loss: Secondary | ICD-10-CM | POA: Diagnosis not present

## 2022-10-10 DIAGNOSIS — F0283 Dementia in other diseases classified elsewhere, unspecified severity, with mood disturbance: Secondary | ICD-10-CM | POA: Diagnosis not present

## 2022-10-10 DIAGNOSIS — R54 Age-related physical debility: Secondary | ICD-10-CM | POA: Diagnosis not present

## 2022-10-10 DIAGNOSIS — R296 Repeated falls: Secondary | ICD-10-CM | POA: Diagnosis not present

## 2022-10-10 DIAGNOSIS — G301 Alzheimer's disease with late onset: Secondary | ICD-10-CM | POA: Diagnosis not present

## 2022-10-10 DIAGNOSIS — I509 Heart failure, unspecified: Secondary | ICD-10-CM | POA: Diagnosis not present

## 2022-10-10 DIAGNOSIS — F0282 Dementia in other diseases classified elsewhere, unspecified severity, with psychotic disturbance: Secondary | ICD-10-CM | POA: Diagnosis not present

## 2022-10-10 DIAGNOSIS — I130 Hypertensive heart and chronic kidney disease with heart failure and stage 1 through stage 4 chronic kidney disease, or unspecified chronic kidney disease: Secondary | ICD-10-CM | POA: Diagnosis not present

## 2022-10-12 DIAGNOSIS — G311 Senile degeneration of brain, not elsewhere classified: Secondary | ICD-10-CM | POA: Diagnosis not present

## 2022-10-12 DIAGNOSIS — F0283 Dementia in other diseases classified elsewhere, unspecified severity, with mood disturbance: Secondary | ICD-10-CM | POA: Diagnosis not present

## 2022-10-12 DIAGNOSIS — F0282 Dementia in other diseases classified elsewhere, unspecified severity, with psychotic disturbance: Secondary | ICD-10-CM | POA: Diagnosis not present

## 2022-10-12 DIAGNOSIS — R634 Abnormal weight loss: Secondary | ICD-10-CM | POA: Diagnosis not present

## 2022-10-12 DIAGNOSIS — I130 Hypertensive heart and chronic kidney disease with heart failure and stage 1 through stage 4 chronic kidney disease, or unspecified chronic kidney disease: Secondary | ICD-10-CM | POA: Diagnosis not present

## 2022-10-12 DIAGNOSIS — I509 Heart failure, unspecified: Secondary | ICD-10-CM | POA: Diagnosis not present

## 2022-10-13 DIAGNOSIS — F0283 Dementia in other diseases classified elsewhere, unspecified severity, with mood disturbance: Secondary | ICD-10-CM | POA: Diagnosis not present

## 2022-10-13 DIAGNOSIS — I130 Hypertensive heart and chronic kidney disease with heart failure and stage 1 through stage 4 chronic kidney disease, or unspecified chronic kidney disease: Secondary | ICD-10-CM | POA: Diagnosis not present

## 2022-10-13 DIAGNOSIS — F0282 Dementia in other diseases classified elsewhere, unspecified severity, with psychotic disturbance: Secondary | ICD-10-CM | POA: Diagnosis not present

## 2022-10-13 DIAGNOSIS — I509 Heart failure, unspecified: Secondary | ICD-10-CM | POA: Diagnosis not present

## 2022-10-13 DIAGNOSIS — R634 Abnormal weight loss: Secondary | ICD-10-CM | POA: Diagnosis not present

## 2022-10-13 DIAGNOSIS — G311 Senile degeneration of brain, not elsewhere classified: Secondary | ICD-10-CM | POA: Diagnosis not present

## 2022-10-17 DIAGNOSIS — I130 Hypertensive heart and chronic kidney disease with heart failure and stage 1 through stage 4 chronic kidney disease, or unspecified chronic kidney disease: Secondary | ICD-10-CM | POA: Diagnosis not present

## 2022-10-17 DIAGNOSIS — I509 Heart failure, unspecified: Secondary | ICD-10-CM | POA: Diagnosis not present

## 2022-10-17 DIAGNOSIS — F0282 Dementia in other diseases classified elsewhere, unspecified severity, with psychotic disturbance: Secondary | ICD-10-CM | POA: Diagnosis not present

## 2022-10-17 DIAGNOSIS — R634 Abnormal weight loss: Secondary | ICD-10-CM | POA: Diagnosis not present

## 2022-10-17 DIAGNOSIS — G311 Senile degeneration of brain, not elsewhere classified: Secondary | ICD-10-CM | POA: Diagnosis not present

## 2022-10-17 DIAGNOSIS — F0283 Dementia in other diseases classified elsewhere, unspecified severity, with mood disturbance: Secondary | ICD-10-CM | POA: Diagnosis not present

## 2022-10-18 DIAGNOSIS — R634 Abnormal weight loss: Secondary | ICD-10-CM | POA: Diagnosis not present

## 2022-10-18 DIAGNOSIS — G311 Senile degeneration of brain, not elsewhere classified: Secondary | ICD-10-CM | POA: Diagnosis not present

## 2022-10-18 DIAGNOSIS — I130 Hypertensive heart and chronic kidney disease with heart failure and stage 1 through stage 4 chronic kidney disease, or unspecified chronic kidney disease: Secondary | ICD-10-CM | POA: Diagnosis not present

## 2022-10-18 DIAGNOSIS — F0283 Dementia in other diseases classified elsewhere, unspecified severity, with mood disturbance: Secondary | ICD-10-CM | POA: Diagnosis not present

## 2022-10-18 DIAGNOSIS — F0282 Dementia in other diseases classified elsewhere, unspecified severity, with psychotic disturbance: Secondary | ICD-10-CM | POA: Diagnosis not present

## 2022-10-18 DIAGNOSIS — I509 Heart failure, unspecified: Secondary | ICD-10-CM | POA: Diagnosis not present

## 2022-10-19 DIAGNOSIS — G311 Senile degeneration of brain, not elsewhere classified: Secondary | ICD-10-CM | POA: Diagnosis not present

## 2022-10-19 DIAGNOSIS — F0282 Dementia in other diseases classified elsewhere, unspecified severity, with psychotic disturbance: Secondary | ICD-10-CM | POA: Diagnosis not present

## 2022-10-19 DIAGNOSIS — F0283 Dementia in other diseases classified elsewhere, unspecified severity, with mood disturbance: Secondary | ICD-10-CM | POA: Diagnosis not present

## 2022-10-19 DIAGNOSIS — R634 Abnormal weight loss: Secondary | ICD-10-CM | POA: Diagnosis not present

## 2022-10-19 DIAGNOSIS — I509 Heart failure, unspecified: Secondary | ICD-10-CM | POA: Diagnosis not present

## 2022-10-19 DIAGNOSIS — I130 Hypertensive heart and chronic kidney disease with heart failure and stage 1 through stage 4 chronic kidney disease, or unspecified chronic kidney disease: Secondary | ICD-10-CM | POA: Diagnosis not present

## 2022-10-20 DIAGNOSIS — G311 Senile degeneration of brain, not elsewhere classified: Secondary | ICD-10-CM | POA: Diagnosis not present

## 2022-10-20 DIAGNOSIS — F0283 Dementia in other diseases classified elsewhere, unspecified severity, with mood disturbance: Secondary | ICD-10-CM | POA: Diagnosis not present

## 2022-10-20 DIAGNOSIS — I509 Heart failure, unspecified: Secondary | ICD-10-CM | POA: Diagnosis not present

## 2022-10-20 DIAGNOSIS — R634 Abnormal weight loss: Secondary | ICD-10-CM | POA: Diagnosis not present

## 2022-10-20 DIAGNOSIS — I130 Hypertensive heart and chronic kidney disease with heart failure and stage 1 through stage 4 chronic kidney disease, or unspecified chronic kidney disease: Secondary | ICD-10-CM | POA: Diagnosis not present

## 2022-10-20 DIAGNOSIS — F0282 Dementia in other diseases classified elsewhere, unspecified severity, with psychotic disturbance: Secondary | ICD-10-CM | POA: Diagnosis not present

## 2023-01-22 DEATH — deceased
